# Patient Record
Sex: Female | Born: 1990 | Hispanic: Refuse to answer | Marital: Single | Smoking: Never smoker
Health system: Southern US, Community
[De-identification: ages and names within clinical notes are randomized; demographics above are authoritative.]

## PROBLEM LIST (undated history)

## (undated) DIAGNOSIS — Z789 Other specified health status: Secondary | ICD-10-CM

## (undated) HISTORY — PX: NO PAST SURGERIES: SHX2092

---
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
## 2011-02-10 LAB — TYPE AND SCREEN: ABO/RH(D): O POS

## 2011-02-10 LAB — CBC
HCT: 38.5 % (ref 36.0–46.0)
Hemoglobin: 12.6 g/dL (ref 12.0–15.0)
MCHC: 32.8 g/dL (ref 30.0–36.0)
RDW: 14.8 % (ref 11.5–15.5)

## 2011-02-10 LAB — RPR: RPR Ser Ql: NONREACTIVE

## 2013-11-09 NOTE — L&D Delivery Note (Signed)
Patient is 23 y.o. G2P1 4831w2d admitted with rupture of membranes in active labor, hx of aortic stenosis with previous child   Delivery Note At 9:25 AM a viable female was delivered via  (Presentation: ; Occiput Anterior).  APGAR: 9, 9; weight  .   Placenta status: Intact, Spontaneous.  Cord: 3 vessels with the following complications: None. In and out cath with 200cc urine, subsequently uterus became firm  Anesthesia: Epidural  Episiotomy: None Lacerations: 1st degree Suture Repair: 3.0 vicryl rapide Est. Blood Loss (mL):  150mL  Mom to postpartum.  Baby to Couplet care / Skin to Skin.  Stacey Clements [name] 10/31/2014, 9:51 AM

## 2014-10-12 LAB — OB RESULTS CONSOLE ABO/RH: RH Type: POSITIVE

## 2014-10-31 ENCOUNTER — Encounter (HOSPITAL_COMMUNITY): Payer: Self-pay | Admitting: *Deleted

## 2014-10-31 ENCOUNTER — Inpatient Hospital Stay (HOSPITAL_COMMUNITY): Payer: Medicaid Other | Admitting: Anesthesiology

## 2014-10-31 ENCOUNTER — Inpatient Hospital Stay (HOSPITAL_COMMUNITY)
Admission: AD | Admit: 2014-10-31 | Discharge: 2014-11-01 | DRG: 775 | Disposition: A | Discharge status: Home / Self Care | Payer: Medicaid Other | Source: Ambulatory Visit | Attending: Family Medicine | Admitting: Family Medicine

## 2014-10-31 DIAGNOSIS — Z3A37 37 weeks gestation of pregnancy: Secondary | ICD-10-CM | POA: Diagnosis present

## 2014-10-31 DIAGNOSIS — O0933 Supervision of pregnancy with insufficient antenatal care, third trimester: Secondary | ICD-10-CM

## 2014-10-31 DIAGNOSIS — O479 False labor, unspecified: Secondary | ICD-10-CM | POA: Diagnosis present

## 2014-10-31 HISTORY — DX: Other specified health status: Z78.9

## 2014-10-31 LAB — CBC
HCT: 38.5 % (ref 36.0–46.0)
Hemoglobin: 13 g/dL (ref 12.0–15.0)
MCH: 28 pg (ref 26.0–34.0)
MCHC: 33.8 g/dL (ref 30.0–36.0)
MCV: 83 fL (ref 78.0–100.0)
PLATELETS: 163 10*3/uL (ref 150–400)
RBC: 4.64 MIL/uL (ref 3.87–5.11)
RDW: 13.5 % (ref 11.5–15.5)
WBC: 11 10*3/uL — AB (ref 4.0–10.5)

## 2014-10-31 LAB — COMPREHENSIVE METABOLIC PANEL
ALK PHOS: 108 U/L (ref 39–117)
ALT: 13 U/L (ref 0–35)
AST: 19 U/L (ref 0–37)
Albumin: 3.3 g/dL — ABNORMAL LOW (ref 3.5–5.2)
Anion gap: 8 (ref 5–15)
BILIRUBIN TOTAL: 0.5 mg/dL (ref 0.3–1.2)
BUN: 11 mg/dL (ref 6–23)
CALCIUM: 8.7 mg/dL (ref 8.4–10.5)
CHLORIDE: 106 meq/L (ref 96–112)
CO2: 22 mmol/L (ref 19–32)
CREATININE: 0.46 mg/dL — AB (ref 0.50–1.10)
GFR calc Af Amer: >90 mL/min (ref >90)
GFR calc non Af Amer: >90 mL/min (ref >90)
GLUCOSE: 91 mg/dL (ref 70–99)
Potassium: 3.7 mmol/L (ref 3.5–5.1)
SODIUM: 136 mmol/L (ref 135–145)
Total Protein: 7 g/dL (ref 6.0–8.3)

## 2014-10-31 LAB — TYPE AND SCREEN
ABO/RH(D): O POS
Antibody Screen: NEGATIVE

## 2014-10-31 LAB — SAMPLE TO BLOOD BANK

## 2014-10-31 LAB — RAPID URINE DRUG SCREEN, HOSP PERFORMED
Amphetamines: NOT DETECTED
BENZODIAZEPINES: NOT DETECTED
Barbiturates: NOT DETECTED
Cocaine: NOT DETECTED
Opiates: NOT DETECTED
TETRAHYDROCANNABINOL: NOT DETECTED

## 2014-10-31 LAB — RPR

## 2014-10-31 LAB — GROUP B STREP BY PCR: Group B strep by PCR: NEGATIVE

## 2014-10-31 LAB — OB RESULTS CONSOLE GBS: GBS: NEGATIVE

## 2014-10-31 LAB — HIV ANTIBODY (ROUTINE TESTING W REFLEX): HIV 1&2 Ab, 4th Generation: NONREACTIVE

## 2014-10-31 LAB — HEPATITIS B SURFACE ANTIGEN: HEP B S AG: NEGATIVE

## 2014-10-31 MED ORDER — LIDOCAINE HCL (PF) 1 % IJ SOLN
30.0000 mL | INTRAMUSCULAR | Status: DC | PRN
  Administered 2014-10-31: 30 mL via SUBCUTANEOUS
  Filled 2014-10-31: qty 30

## 2014-10-31 MED ORDER — ACETAMINOPHEN 325 MG PO TABS: 650.0000 mg | ORAL_TABLET | ORAL | Status: DC | PRN

## 2014-10-31 MED ORDER — WITCH HAZEL-GLYCERIN EX PADS: 1.0000 "application " | MEDICATED_PAD | CUTANEOUS | Status: DC | PRN

## 2014-10-31 MED ORDER — BISACODYL 10 MG RE SUPP: 10.0000 mg | Freq: Every day | RECTAL | Status: DC | PRN

## 2014-10-31 MED ORDER — OXYTOCIN BOLUS FROM INFUSION
500.0000 mL | INTRAVENOUS | Status: DC
  Administered 2014-10-31: 500 mL via INTRAVENOUS

## 2014-10-31 MED ORDER — SIMETHICONE 80 MG PO CHEW
80.0000 mg | CHEWABLE_TABLET | ORAL | Status: DC | PRN
Start: 2014-10-31 — End: 2014-11-01

## 2014-10-31 MED ORDER — SODIUM CHLORIDE 0.9 % IJ SOLN: 3.0000 mL | Freq: Two times a day (BID) | INTRAMUSCULAR | Status: DC

## 2014-10-31 MED ORDER — TETANUS-DIPHTH-ACELL PERTUSSIS 5-2.5-18.5 LF-MCG/0.5 IM SUSP: 0.5000 mL | Freq: Once | INTRAMUSCULAR | Status: DC

## 2014-10-31 MED ORDER — LACTATED RINGERS IV SOLN: 500.0000 mL | INTRAVENOUS | Status: DC | PRN

## 2014-10-31 MED ORDER — DIPHENHYDRAMINE HCL 50 MG/ML IJ SOLN: 12.5000 mg | INTRAMUSCULAR | Status: DC | PRN

## 2014-10-31 MED ORDER — FLEET ENEMA 7-19 GM/118ML RE ENEM: 1.0000 | ENEMA | Freq: Every day | RECTAL | Status: DC | PRN

## 2014-10-31 MED ORDER — DIPHENHYDRAMINE HCL 25 MG PO CAPS: 25.0000 mg | ORAL_CAPSULE | Freq: Four times a day (QID) | ORAL | Status: DC | PRN

## 2014-10-31 MED ORDER — PHENYLEPHRINE 40 MCG/ML (10ML) SYRINGE FOR IV PUSH (FOR BLOOD PRESSURE SUPPORT)
80.0000 ug | PREFILLED_SYRINGE | INTRAVENOUS | Status: DC | PRN
  Filled 2014-10-31: qty 2

## 2014-10-31 MED ORDER — FLEET ENEMA 7-19 GM/118ML RE ENEM: 1.0000 | ENEMA | RECTAL | Status: DC | PRN

## 2014-10-31 MED ORDER — ONDANSETRON HCL 4 MG PO TABS: 4.0000 mg | ORAL_TABLET | ORAL | Status: DC | PRN

## 2014-10-31 MED ORDER — SODIUM CHLORIDE 0.9 % IJ SOLN: 3.0000 mL | INTRAMUSCULAR | Status: DC | PRN

## 2014-10-31 MED ORDER — PHENYLEPHRINE 40 MCG/ML (10ML) SYRINGE FOR IV PUSH (FOR BLOOD PRESSURE SUPPORT)
80.0000 ug | PREFILLED_SYRINGE | INTRAVENOUS | Status: DC | PRN
  Filled 2014-10-31: qty 10
  Filled 2014-10-31: qty 2

## 2014-10-31 MED ORDER — EPHEDRINE 5 MG/ML INJ
10.0000 mg | INTRAVENOUS | Status: DC | PRN
  Filled 2014-10-31: qty 2

## 2014-10-31 MED ORDER — CITRIC ACID-SODIUM CITRATE 334-500 MG/5ML PO SOLN: 30.0000 mL | ORAL | Status: DC | PRN

## 2014-10-31 MED ORDER — SODIUM CHLORIDE 0.9 % IV SOLN: 250.0000 mL | INTRAVENOUS | Status: DC | PRN

## 2014-10-31 MED ORDER — LIDOCAINE HCL (PF) 1 % IJ SOLN
INTRAMUSCULAR | Status: DC | PRN
  Administered 2014-10-31: 4 mL
  Administered 2014-10-31: 6 mL

## 2014-10-31 MED ORDER — ONDANSETRON HCL 4 MG/2ML IJ SOLN: 4.0000 mg | Freq: Four times a day (QID) | INTRAMUSCULAR | Status: DC | PRN

## 2014-10-31 MED ORDER — OXYCODONE-ACETAMINOPHEN 5-325 MG PO TABS: 1.0000 | ORAL_TABLET | ORAL | Status: DC | PRN

## 2014-10-31 MED ORDER — FENTANYL 2.5 MCG/ML BUPIVACAINE 1/10 % EPIDURAL INFUSION (WH - ANES)
14.0000 mL/h | INTRAMUSCULAR | Status: DC | PRN
  Administered 2014-10-31: 14 mL/h via EPIDURAL
  Filled 2014-10-31: qty 125

## 2014-10-31 MED ORDER — OXYTOCIN 40 UNITS IN LACTATED RINGERS INFUSION - SIMPLE MED: 62.5000 mL/h | INTRAVENOUS | Status: DC | PRN

## 2014-10-31 MED ORDER — OXYCODONE-ACETAMINOPHEN 5-325 MG PO TABS: 2.0000 | ORAL_TABLET | ORAL | Status: DC | PRN

## 2014-10-31 MED ORDER — FENTANYL 2.5 MCG/ML BUPIVACAINE 1/10 % EPIDURAL INFUSION (WH - ANES): 14.0000 mL/h | INTRAMUSCULAR | Status: DC | PRN

## 2014-10-31 MED ORDER — IBUPROFEN 600 MG PO TABS
600.0000 mg | ORAL_TABLET | Freq: Four times a day (QID) | ORAL | Status: DC
  Administered 2014-10-31 – 2014-11-01 (×5): 600 mg via ORAL
  Filled 2014-10-31 (×5): qty 1

## 2014-10-31 MED ORDER — LACTATED RINGERS IV SOLN
INTRAVENOUS | Status: DC
  Administered 2014-10-31: 08:00:00 via INTRAVENOUS

## 2014-10-31 MED ORDER — PRENATAL MULTIVITAMIN CH
1.0000 | ORAL_TABLET | Freq: Every day | ORAL | Status: DC
  Administered 2014-10-31 – 2014-11-01 (×2): 1 via ORAL
  Filled 2014-10-31 (×2): qty 1

## 2014-10-31 MED ORDER — DIBUCAINE 1 % RE OINT: 1.0000 "application " | TOPICAL_OINTMENT | RECTAL | Status: DC | PRN

## 2014-10-31 MED ORDER — ONDANSETRON HCL 4 MG/2ML IJ SOLN: 4.0000 mg | INTRAMUSCULAR | Status: DC | PRN

## 2014-10-31 MED ORDER — LACTATED RINGERS IV SOLN: 500.0000 mL | Freq: Once | INTRAVENOUS | Status: DC

## 2014-10-31 MED ORDER — ZOLPIDEM TARTRATE 5 MG PO TABS: 5.0000 mg | ORAL_TABLET | Freq: Every evening | ORAL | Status: DC | PRN

## 2014-10-31 MED ORDER — OXYTOCIN 40 UNITS IN LACTATED RINGERS INFUSION - SIMPLE MED
62.5000 mL/h | INTRAVENOUS | Status: DC
  Filled 2014-10-31: qty 1000

## 2014-10-31 MED ORDER — SENNOSIDES-DOCUSATE SODIUM 8.6-50 MG PO TABS
2.0000 | ORAL_TABLET | ORAL | Status: DC
  Administered 2014-11-01: 2 via ORAL
  Filled 2014-10-31: qty 2

## 2014-10-31 MED ORDER — BENZOCAINE-MENTHOL 20-0.5 % EX AERO: 1.0000 "application " | INHALATION_SPRAY | CUTANEOUS | Status: DC | PRN

## 2014-10-31 MED ORDER — LANOLIN HYDROUS EX OINT: TOPICAL_OINTMENT | CUTANEOUS | Status: DC | PRN

## 2014-10-31 NOTE — Anesthesia Preprocedure Evaluation (Signed)

## 2014-10-31 NOTE — Progress Notes (Signed)
UR chart review completed.  

## 2014-10-31 NOTE — Lactation Note (Addendum)
This note was copied from the chart of Stacey Martrice Furnas. Lactation Consultation Note  P2. Mother's nipples are cracked at base and very sore. Assisted mother in latching baby in fb, cross cradle and side lying. Mother winced in pain with each latch.  Demonstrated how to do chin tug. Assessed infant's mouth and noted some limited movement of tongue. Mother states she wants to formula feed and FOB encouraged mother. Mother has been wearing comfort gels. Suggested mother that she could pump if she is too sore to breastfeed but mother stated she preferred to give baby formula. Discussed supply and demand.  Suggest to mother to call if she wants further assistance. Provided mother with a hand pump to keep stimulating her breasts.  Patient Name: Stacey Jolene SchimkeHnhoa Buchholz ZOXWR'UToday's Clements: 10/31/2014 Reason for consult: Follow-up assessment   Maternal Data Has patient been taught Hand Expression?: Yes  Feeding Feeding Type: Breast Fed Length of feed: 15 min  LATCH Score/Interventions Latch: Repeated attempts needed to sustain latch, nipple held in mouth throughout feeding, stimulation needed to elicit sucking reflex. Intervention(s): Adjust position;Assist with latch;Breast compression;Breast massage  Audible Swallowing: A few with stimulation  Type of Nipple: Everted at rest and after stimulation  Comfort (Breast/Nipple): Engorged, cracked, bleeding, large blisters, severe discomfort Problem noted: Cracked, bleeding, blisters, bruises Intervention(s): Expressed breast milk to nipple     Hold (Positioning): Assistance needed to correctly position infant at breast and maintain latch. Intervention(s): Position options;Skin to skin;Support Pillows;Breastfeeding basics reviewed  LATCH Score: 5  Lactation Tools Discussed/Used     Consult Status Consult Status: Follow-up Clements: 11/01/14 Follow-up type: In-patient    [name], [name] [facility] 10/31/2014, 2:07 PM

## 2014-10-31 NOTE — Anesthesia Procedure Notes (Signed)

## 2014-10-31 NOTE — MAU Note (Signed)
Dr. Jackelyn KnifeMeisinger notified and pt will be assigned to Faculty as she left the practice at 20 weeks with little communication.

## 2014-10-31 NOTE — H&P (Signed)
LABOR ADMISSION HISTORY AND PHYSICAL  Stacey Clements is a 23 y.o. female G2P1 with IUP at 4370w2d  presenting in active labor with rupture of membranes.  She desires an epidural for labor pain control. She plans on breast feeding. She request depo for birth control.  Estimated Date of Delivery: 11/19/14  Prenatal History/Complications:  Past Medical History: Past Medical History  Diagnosis Date  . Medical history non-contributory     Past Surgical History: Past Surgical History  Procedure Laterality Date  . No past surgeries      Obstetrical History: OB History    Gravida Para Term Preterm AB TAB SAB Ectopic Multiple Living   2 1        1       Social History: History   Social History  . Marital Status: Single    Spouse Name: N/A    Number of Children: N/A  . Years of Education: N/A   Social History Main Topics  . Smoking status: Never Smoker   . Smokeless tobacco: None  . Alcohol Use: No  . Drug Use: No  . Sexual Activity: Yes   Other Topics Concern  . None   Social History Narrative  . None    Family History: History reviewed. No pertinent family history.  Allergies: No Known Allergies  No prescriptions prior to admission     Review of Systems   All systems reviewed and negative except as stated in HPI  Blood pressure 104/45, pulse 90, temperature 98.8 F (37.1 C), temperature source Oral, resp. rate 18, height 5\' 1"  (1.549 m), weight 173 lb 2 oz (78.529 kg). General appearance: alert and cooperative Lungs: clear to auscultation bilaterally Heart: regular rate and rhythm Abdomen: soft, non-tender; bowel sounds normal Extremities: Homans sign is negative, no sign of DVT Presentation: cephalic Dilation: 10 Effacement (%): 100 Station: +1 Exam by:: A.Davis,RN   Prenatal labs: ABO, Rh: O/Positive/-- (12/04 0000) Antibody:   Rubella:   RPR:    HBsAg:    HIV:    GBS: Negative (12/23 0000)  1 hr Glucola not available Genetic screening  Not  available Anatomy US not available   Results for orders placed or performed during the hospital encounter of 10/31/14 (from the past 24 hour(s))  OB RESULT CONSOLE Group B Strep   Collection Time: 10/31/14 12:00 AM  Result Value Ref Range   GBS Negative   Group B strep by PCR   Collection Time: 10/31/14  6:10 AM  Result Value Ref Range   Group B strep by PCR NEGATIVE NEGATIVE  CBC   Collection Time: 10/31/14  6:40 AM  Result Value Ref Range   WBC 11.0 (H) 4.0 - 10.5 K/uL   RBC 4.64 3.87 - 5.11 MIL/uL   Hemoglobin 13.0 12.0 - 15.0 g/dL   HCT 21.338.5 08.636.0 - 57.846.0 %   MCV 83.0 78.0 - 100.0 fL   MCH 28.0 26.0 - 34.0 pg   MCHC 33.8 30.0 - 36.0 g/dL   RDW 46.913.5 62.911.5 - 52.815.5 %   Platelets 163 150 - 400 K/uL  Comprehensive metabolic panel   Collection Time: 10/31/14  6:40 AM  Result Value Ref Range   Sodium 136 135 - 145 mmol/L   Potassium 3.7 3.5 - 5.1 mmol/L   Chloride 106 96 - 112 mEq/L   CO2 22 19 - 32 mmol/L   Glucose, Bld 91 70 - 99 mg/dL   BUN 11 6 - 23 mg/dL   Creatinine, Ser 4.130.46 (L) 0.50 -  1.10 mg/dL   Calcium 8.7 8.4 - 03.410.5 mg/dL   Total Protein 7.0 6.0 - 8.3 g/dL   Albumin 3.3 (L) 3.5 - 5.2 g/dL   AST 19 0 - 37 U/L   ALT 13 0 - 35 U/L   Alkaline Phosphatase 108 39 - 117 U/L   Total Bilirubin 0.5 0.3 - 1.2 mg/dL   GFR calc non Af Amer >90 >90 mL/min   GFR calc Af Amer >90 >90 mL/min   Anion gap 8 5 - 15  Sample to Blood Bank   Collection Time: 10/31/14  6:40 AM  Result Value Ref Range   Blood Bank Specimen SAMPLE AVAILABLE FOR TESTING    Sample Expiration 11/03/2014   Drug screen panel, emergency   Collection Time: 10/31/14  6:45 AM  Result Value Ref Range   Opiates NONE DETECTED NONE DETECTED   Cocaine NONE DETECTED NONE DETECTED   Benzodiazepines NONE DETECTED NONE DETECTED   Amphetamines NONE DETECTED NONE DETECTED   Tetrahydrocannabinol NONE DETECTED NONE DETECTED   Barbiturates NONE DETECTED NONE DETECTED    Patient Active Problem List   Diagnosis Date  Noted  . Threatened labor 10/31/2014    Assessment: Stacey Clements is a 23 y.o. G2P1 at 6562w2d here for rupture of membranes in active labor, received care initially with Dr. Jackelyn KnifeMeisinger, [place] and now here with no records of care  #ID:  PCR GBS neg #MOF: breast #MOC:depo #Circ:  N/a  Significant hx: hx of aortic stenosis with previous infant  Stacey Clements 10/31/2014, 9:46 AM

## 2014-10-31 NOTE — MAU Note (Addendum)
PT SAYS   AT 0300-   FLUID  WAS COMING  OUT IN BED-   SOAKED A  TOWEL.     NONE  NOW IN TRIAGE.    NO VE IN  OFFICE.   DENIES HSV AND   MRSA.   GBS-   NOT  DONE.   SEEN LAST    3 MTHS  AGO.  SHE JUST  MOVED  BACK  HERE LAST WED.

## 2014-10-31 NOTE — Anesthesia Postprocedure Evaluation (Signed)
  Anesthesia Post-op Note  Anesthesia Post Note  Patient: Stacey Clements  Procedure(s) Performed: * No procedures listed *  Anesthesia type: Epidural  Patient location: Mother/Baby  Post pain: Pain level controlled  Post assessment: Post-op Vital signs reviewed  Last Vitals:  Filed Vitals:   10/31/14 1411  BP: 118/68  Pulse: 95  Temp: 36.8 C  Resp: 18    Post vital signs: Reviewed  Level of consciousness:alert  Complications: No apparent anesthesia complications

## 2014-10-31 NOTE — Lactation Note (Signed)
This note was copied from the chart of Stacey Clements. Lactation Consultation Note  P2, Mother states she had diffculty latch first baby and pumped for 2 months. Mother had breastfed this baby x2 and she has cracks at the base of her nipple. Reviewed how to apply ebm and provided her with comfort gels. Left LC phone number and suggest she call for assistance with next feeding. Mom made aware of O/P services, breastfeeding support groups, community resources, and our phone # for post-discharge questions.    Patient Name: Stacey Jolene SchimkeHnhoa Vidrio ONGEX'BToday's Date: 10/31/2014 Reason for consult: Initial assessment   Maternal Data Has patient been taught Hand Expression?: Yes  Feeding Feeding Type: Breast Fed Length of feed: 15 min  LATCH Score/Interventions Latch: Repeated attempts needed to sustain latch, nipple held in mouth throughout feeding, stimulation needed to elicit sucking reflex. Intervention(s): Adjust position  Audible Swallowing: Spontaneous and intermittent  Type of Nipple: Everted at rest and after stimulation  Comfort (Breast/Nipple): Soft / non-tender     Hold (Positioning): Assistance needed to correctly position infant at breast and maintain latch.  LATCH Score: 8  Lactation Tools Discussed/Used     Consult Status Consult Status: Follow-up Date: 11/01/14 Follow-up type: In-patient    [name], [name] 10/31/2014, 12:31 PM

## 2014-10-31 NOTE — MAU Note (Signed)
Pt. To be admitted -

## 2014-11-01 NOTE — Discharge Instructions (Signed)

## 2014-11-01 NOTE — Discharge Summary (Signed)
  Obstetric Discharge Summary Reason for Admission: rupture of membranes Prenatal Procedures: none Intrapartum Procedures: spontaneous vaginal delivery Postpartum Procedures: none Complications-Operative and Postpartum: 1st degree perineal laceration  Patient is 23 y.o. G2P1 4050w2d admitted with rupture of membranes in active labor, hx of aortic stenosis with previous child   Delivery Note At 9:25 AM a viable female was delivered via  (Presentation: ; Occiput Anterior).  APGAR: 9, 9; weight  .   Placenta status: Intact, Spontaneous.  Cord: 3 vessels with the following complications: None. In and out cath with 200cc urine, subsequently uterus became firm  Anesthesia: Epidural  Episiotomy: None Lacerations: 1st degree Suture Repair: 3.0 vicryl rapide Est. Blood Loss (mL):  150mL  Mom to postpartum.  Baby to Couplet care / Skin to Skin.  Hospital Course:  Active Problems:   Threatened labor   Stacey Clements is a 23 y.o. G2P1001 s/p SVD.  Patient presented to [facility] with [facility], had an initial prenatal visit with Dr. Jackelyn KnifeMeisinger [place] then moved back.  Dr. Jackelyn KnifeMeisinger declined and referred patient to [facility].  She has postpartum course that was uncomplicated including no problems with ambulating, PO intake, urination, pain, or bleeding. The pt feels ready to go home and  will be discharged with outpatient follow-up.   Today: No acute events overnight.  Pt denies problems with ambulating, voiding or po intake.  She denies nausea or vomiting.  Pain is well controlled.  Lochia Moderate.  Plan for birth control is  depo.  Method of Feeding: breast   H/H: Lab Results  Component Value Date/Time   HGB 13.0 10/31/2014 06:40 AM   HCT 38.5 10/31/2014 06:40 AM    Discharge Diagnoses: Term Pregnancy-delivered  Discharge Information: Date: 11/01/2014 Activity: pelvic rest Diet: routine  Medications: None Breast feeding:   Yes Condition: stable Instructions: refer to handout Discharge to: home      Medication List    TAKE these medications        prenatal multivitamin Tabs tablet  Take 1 tablet by mouth daily at 12 noon.           Follow-up Information    Follow up with [facility] In 6 weeks.   Contact information:   [address] [place] [id number] [phone]       Perry MountACOSTA,Danija Gosa [name] ,MD OB Fellow 11/01/2014,12:46 PM

## 2014-11-01 NOTE — Progress Notes (Signed)
Clinical Social Work Department BRIEF PSYCHOSOCIAL ASSESSMENT 11/01/2014  Patient:  Stacey Clements,Stacey Clements     Account Number:  [id number]     Admit date:  10/31/2014  Clinical Social Worker:  Lucita Ferrara, CLINICAL SOCIAL WORKER  Date/Time:  11/01/2014 09:00 AM  Referred by:  Central Nursery  Date Referred:  10/31/2014 Referred for  Other - See comment   Other Referral:   Unable to confirm prenatal care during middle of pregnancy due to move to [place]. Baby's UDS is negative and the MDS is pending.    Interview type:  Patient  PSYCHOSOCIAL DATA Living Status:  FAMILY Primary support name:  Merrilee Jansky Primary support relationship to patient:  FOB Degree of support available:   MOB reported that she lives with her uncle, aunt, and older son.  She stated that the FOB lives at a different residence but is supportive and involved.   CURRENT CONCERNS Current Concerns  None Noted   SOCIAL WORK ASSESSMENT / PLAN CSW met with the MOB due to inability to confirm prenatal care.  MOB initiated care in [place], but moved to [place].  She reported receiving prenatal care in [place], but records have not yet been obtained.  MOB moved back to [place] one week ago.  MOB presented as receptive and easily engaged. She displayed an appropriate range in affect, presented in a pleasant mood, and was observed to be attending to and bonding with the baby.    MOB confirmed move to [place] during the pregnancy.  She shared that she moved since her uncle died (who lived in [place]), and she discussed that she and the rest of her family decided to move in order to be closer to other members of the family.  She shared that they did not enjoy living in [place], and she continued to discuss the challenges she faced to secure Medicaid.  She stated that they decided to move back to [place] due to "liking it better", and discussed plans to remain in [place].  MOB shared that she currently lives with her aunt and uncle, feels  well supported, and has the home prepared for the baby.  MOB reported low stress secondary to recent move, and denied presence of additional psychosocial stressors.   MOB acknowledged hospital drug screen policy due to inability to confirm prenatal care.  MOB denied all substance use during her pregnancy.    Assessment/plan status:  No Further Intervention Required/No barriers to discharge.  Other assessment/ plan:   CSW to monitor MDS and will make a CPS report if positive.   Information/referral to community resources:   No referrals needed at this time.   PATIENT'S/FAMILY'S RESPONSE TO PLAN OF CARE: MOB denied quesitons or concerns related to hospital drug screen policy.  She expressed appreication for the visit.

## 2014-12-21 ENCOUNTER — Ambulatory Visit (INDEPENDENT_AMBULATORY_CARE_PROVIDER_SITE_OTHER): Payer: Medicaid Other | Admitting: Obstetrics & Gynecology

## 2014-12-21 ENCOUNTER — Encounter: Payer: Self-pay | Admitting: Obstetrics & Gynecology

## 2014-12-21 VITALS — BP 115/63 | HR 85 | Temp 98.3°F | Wt 148.5 lb

## 2014-12-21 DIAGNOSIS — Z01812 Encounter for preprocedural laboratory examination: Secondary | ICD-10-CM

## 2014-12-21 DIAGNOSIS — Z3042 Encounter for surveillance of injectable contraceptive: Secondary | ICD-10-CM | POA: Insufficient documentation

## 2014-12-21 LAB — POCT PREGNANCY, URINE: PREG TEST UR: NEGATIVE

## 2014-12-21 MED ORDER — MEDROXYPROGESTERONE ACETATE 104 MG/0.65ML ~~LOC~~ SUSP
104.0000 mg | Freq: Once | SUBCUTANEOUS | Status: AC
  Administered 2014-12-21: 104 mg via SUBCUTANEOUS

## 2014-12-21 MED ORDER — MEDROXYPROGESTERONE ACETATE 104 MG/0.65ML ~~LOC~~ SUSP: 104.0000 mg | SUBCUTANEOUS | Status: DC

## 2014-12-21 NOTE — Progress Notes (Signed)
Patient here today for PP visit. Reports having protected intercourse last week. Desires depo for contraception. UPT obtained.

## 2014-12-21 NOTE — Patient Instructions (Signed)
Etonogestrel implant What is this medicine? ETONOGESTREL (et oh noe JES trel) is a contraceptive (birth control) device. It is used to prevent pregnancy. It can be used for up to 3 years. This medicine may be used for other purposes; ask your health care provider or pharmacist if you have questions. COMMON BRAND NAME(S): Implanon, Nexplanon What should I tell my health care provider before I take this medicine? They need to know if you have any of these conditions: -abnormal vaginal bleeding -blood vessel disease or blood clots -cancer of the breast, cervix, or liver -depression -diabetes -gallbladder disease -headaches -heart disease or recent heart attack -high blood pressure -high cholesterol -kidney disease -liver disease -renal disease -seizures -tobacco smoker -an unusual or allergic reaction to etonogestrel, other hormones, anesthetics or antiseptics, medicines, foods, dyes, or preservatives -pregnant or trying to get pregnant -breast-feeding How should I use this medicine? This device is inserted just under the skin on the inner side of your upper arm by a health care professional. Talk to your pediatrician regarding the use of this medicine in children. Special care may be needed. Overdosage: If you think you've taken too much of this medicine contact a poison control center or emergency room at once. Overdosage: If you think you have taken too much of this medicine contact a poison control center or emergency room at once. NOTE: This medicine is only for you. Do not share this medicine with others. What if I miss a dose? This does not apply. What may interact with this medicine? Do not take this medicine with any of the following medications: -amprenavir -bosentan -fosamprenavir This medicine may also interact with the following medications: -barbiturate medicines for inducing sleep or treating seizures -certain medicines for fungal infections like ketoconazole and  itraconazole -griseofulvin -medicines to treat seizures like carbamazepine, felbamate, oxcarbazepine, phenytoin, topiramate -modafinil -phenylbutazone -rifampin -some medicines to treat HIV infection like atazanavir, indinavir, lopinavir, nelfinavir, tipranavir, ritonavir -St. John's wort This list may not describe all possible interactions. Give your health care provider a list of all the medicines, herbs, non-prescription drugs, or dietary supplements you use. Also tell them if you smoke, drink alcohol, or use illegal drugs. Some items may interact with your medicine. What should I watch for while using this medicine? This product does not protect you against HIV infection (AIDS) or other sexually transmitted diseases. You should be able to feel the implant by pressing your fingertips over the skin where it was inserted. Tell your doctor if you cannot feel the implant. What side effects may I notice from receiving this medicine? Side effects that you should report to your doctor or health care professional as soon as possible: -allergic reactions like skin rash, itching or hives, swelling of the face, lips, or tongue -breast lumps -changes in vision -confusion, trouble speaking or understanding -dark urine -depressed mood -general ill feeling or flu-like symptoms -light-colored stools -loss of appetite, nausea -right upper belly pain -severe headaches -severe pain, swelling, or tenderness in the abdomen -shortness of breath, chest pain, swelling in a leg -signs of pregnancy -sudden numbness or weakness of the face, arm or leg -trouble walking, dizziness, loss of balance or coordination -unusual vaginal bleeding, discharge -unusually weak or tired -yellowing of the eyes or skin Side effects that usually do not require medical attention (Report these to your doctor or health care professional if they continue or are bothersome.): -acne -breast pain -changes in  weight -cough -fever or chills -headache -irregular menstrual bleeding -itching, burning, and   vaginal discharge -pain or difficulty passing urine -sore throat This list may not describe all possible side effects. Call your doctor for medical advice about side effects. You may report side effects to FDA at 1-800-FDA-1088. Where should I keep my medicine? This drug is given in a hospital or clinic and will not be stored at home. NOTE: This sheet is a summary. It may not cover all possible information. If you have questions about this medicine, talk to your doctor, pharmacist, or health care provider.  2015, Elsevier/Gold Standard. (2012-05-02 15:37:45) Medroxyprogesterone injection [Contraceptive] What is this medicine? MEDROXYPROGESTERONE (me DROX ee proe JES te rone) contraceptive injections prevent pregnancy. They provide effective birth control for 3 months. Depo-subQ Provera 104 is also used for treating pain related to endometriosis. This medicine may be used for other purposes; ask your health care provider or pharmacist if you have questions. COMMON BRAND NAME(S): Depo-Provera, Depo-subQ Provera 104 What should I tell my health care provider before I take this medicine? They need to know if you have any of these conditions: -frequently drink alcohol -asthma -blood vessel disease or a history of a blood clot in the lungs or legs -bone disease such as osteoporosis -breast cancer -diabetes -eating disorder (anorexia nervosa or bulimia) -high blood pressure -HIV infection or AIDS -kidney disease -liver disease -mental depression -migraine -seizures (convulsions) -stroke -tobacco smoker -vaginal bleeding -an unusual or allergic reaction to medroxyprogesterone, other hormones, medicines, foods, dyes, or preservatives -pregnant or trying to get pregnant -breast-feeding How should I use this medicine? Depo-Provera Contraceptive injection is given into a muscle. Depo-subQ  Provera 104 injection is given under the skin. These injections are given by a health care professional. You must not be pregnant before getting an injection. The injection is usually given during the first 5 days after the start of a menstrual period or 6 weeks after delivery of a baby. Talk to your pediatrician regarding the use of this medicine in children. Special care may be needed. These injections have been used in female children who have started having menstrual periods. Overdosage: If you think you have taken too much of this medicine contact a poison control center or emergency room at once. NOTE: This medicine is only for you. Do not share this medicine with others. What if I miss a dose? Try not to miss a dose. You must get an injection once every 3 months to maintain birth control. If you cannot keep an appointment, call and reschedule it. If you wait longer than 13 weeks between Depo-Provera contraceptive injections or longer than 14 weeks between Depo-subQ Provera 104 injections, you could get pregnant. Use another method for birth control if you miss your appointment. You may also need a pregnancy test before receiving another injection. What may interact with this medicine? Do not take this medicine with any of the following medications: -bosentan This medicine may also interact with the following medications: -aminoglutethimide -antibiotics or medicines for infections, especially rifampin, rifabutin, rifapentine, and griseofulvin -aprepitant -barbiturate medicines such as phenobarbital or primidone -bexarotene -carbamazepine -medicines for seizures like ethotoin, felbamate, oxcarbazepine, phenytoin, topiramate -modafinil -St. John's wort This list may not describe all possible interactions. Give your health care provider a list of all the medicines, herbs, non-prescription drugs, or dietary supplements you use. Also tell them if you smoke, drink alcohol, or use illegal drugs.  Some items may interact with your medicine. What should I watch for while using this medicine? This drug does not protect you against HIV infection (  AIDS) or other sexually transmitted diseases. Use of this product may cause you to lose calcium from your bones. Loss of calcium may cause weak bones (osteoporosis). Only use this product for more than 2 years if other forms of birth control are not right for you. The longer you use this product for birth control the more likely you will be at risk for weak bones. Ask your health care professional how you can keep strong bones. You may have a change in bleeding pattern or irregular periods. Many females stop having periods while taking this drug. If you have received your injections on time, your chance of being pregnant is very low. If you think you may be pregnant, see your health care professional as soon as possible. Tell your health care professional if you want to get pregnant within the next year. The effect of this medicine may last a long time after you get your last injection. What side effects may I notice from receiving this medicine? Side effects that you should report to your doctor or health care professional as soon as possible: -allergic reactions like skin rash, itching or hives, swelling of the face, lips, or tongue -breast tenderness or discharge -breathing problems -changes in vision -depression -feeling faint or lightheaded, falls -fever -pain in the abdomen, chest, groin, or leg -problems with balance, talking, walking -unusually weak or tired -yellowing of the eyes or skin Side effects that usually do not require medical attention (report to your doctor or health care professional if they continue or are bothersome): -acne -fluid retention and swelling -headache -irregular periods, spotting, or absent periods -temporary pain, itching, or skin reaction at site where injected -weight gain This list may not describe all  possible side effects. Call your doctor for medical advice about side effects. You may report side effects to FDA at 1-800-FDA-1088. Where should I keep my medicine? This does not apply. The injection will be given to you by a health care professional. NOTE: This sheet is a summary. It may not cover all possible information. If you have questions about this medicine, talk to your doctor, pharmacist, or health care provider.  2015, Elsevier/Gold Standard. (2008-11-16 18:37:56)

## 2014-12-21 NOTE — Progress Notes (Signed)
Patient ID: Stacey Clements, female   DOB: 09/08/1991, 24 y.o.   MRN: [id number] Subjective: wants Depo but might switch to Nexplanon     Stacey Clements is a 24 y.o. female who presents for a postpartum visit. She is 6 weeks postpartum following a spontaneous vaginal delivery. I have fully reviewed the prenatal and intrapartum course. The delivery was at 39 gestational weeks. Outcome: spontaneous vaginal delivery. Anesthesia: epidural. Postpartum course has been good. Baby's course has been good. Baby is feeding by breast. Bleeding no bleeding. Bowel function is normal. Bladder function is normal. Patient is sexually active. Contraception method is condoms. Postpartum depression screening: negative.  The following portions of the patient's history were reviewed and updated as appropriate: allergies, current medications, past family history, past medical history, past social history, past surgical history and problem list.  Review of Systems Pertinent items are noted in HPI.   Objective:    BP 115/63 mmHg  Pulse 85  Temp(Src) 98.3 F (36.8 C) (Oral)  Wt 148 lb 8 oz (67.359 kg)  Breastfeeding? Yes  General:  alert, cooperative and no distress   Breasts:           Abdomen: soft, non-tender; bowel sounds normal; no masses,  no organomegaly   Vulva:  not evaluated  Vagina: not evaluated  Cervix:     Corpus:    Adnexa:  not evaluated  Rectal Exam: Not performed.        Assessment:     normal postpartum exam. Pap smear not done at today's visit.   Plan:    1. Contraception: Depo-Provera injections 2. Info on Nexplanon and counseled 3. Follow up in: 3 months or as needed.    Adam PhenixJames G Arnold, MD 12/21/2014

## 2015-03-13 ENCOUNTER — Ambulatory Visit: Payer: Medicaid Other

## 2016-11-09 NOTE — L&D Delivery Note (Signed)
Delivery Note At 6:44 AM a viable female was delivered via Vaginal, Spontaneous (Presentation: direct OA).  APGAR: 9/9 ; weight pending  .   Placenta status: intact 3VC:  with no following complications: .  Cord pH: n/a  Anesthesia:  epidural Episiotomy: None Lacerations: none requiring repair Suture Repair: n/a Est. Blood Loss (mL):  200  Mom to postpartum.  Baby to Couplet care / Skin to Skin.  Marthenia RollingScott Les Longmore 10/17/2017, 6:59 AM

## 2017-06-15 ENCOUNTER — Encounter: Payer: Self-pay | Admitting: Certified Nurse Midwife

## 2017-06-15 ENCOUNTER — Other Ambulatory Visit (HOSPITAL_COMMUNITY)
Admission: RE | Admit: 2017-06-15 | Discharge: 2017-06-15 | Disposition: A | Discharge status: Home / Self Care | Payer: Medicaid Other | Source: Ambulatory Visit | Attending: Certified Nurse Midwife | Admitting: Certified Nurse Midwife

## 2017-06-15 ENCOUNTER — Ambulatory Visit (INDEPENDENT_AMBULATORY_CARE_PROVIDER_SITE_OTHER): Payer: Medicaid Other | Admitting: Certified Nurse Midwife

## 2017-06-15 DIAGNOSIS — Z3482 Encounter for supervision of other normal pregnancy, second trimester: Secondary | ICD-10-CM | POA: Diagnosis not present

## 2017-06-15 DIAGNOSIS — Z3480 Encounter for supervision of other normal pregnancy, unspecified trimester: Secondary | ICD-10-CM | POA: Diagnosis not present

## 2017-06-15 DIAGNOSIS — O0932 Supervision of pregnancy with insufficient antenatal care, second trimester: Secondary | ICD-10-CM

## 2017-06-15 NOTE — Progress Notes (Signed)
Subjective:    Stacey Clements is being seen today for her first obstetrical visit.  This is not a planned pregnancy. She is at [redacted]w[redacted]d gestation. Her obstetrical history is significant for none. Relationship with FOB: spouse, living together. Patient does intend to breast feed. Pregnancy history fully reviewed.  The information documented in the HPI was reviewed and verified.  Menstrual History: OB History    Gravida Para Term Preterm AB Living   3 2 2     2    SAB TAB Ectopic Multiple Live Births         0 2       Patient's last menstrual period was 01/22/2017 (approximate).    Past Medical History:  Diagnosis Date  . Medical history non-contributory     Past Surgical History:  Procedure Laterality Date  . NO PAST SURGERIES       (Not in a hospital admission) No Known Allergies  Social History  Substance Use Topics  . Smoking status: Never Smoker  . Smokeless tobacco: Never Used  . Alcohol use No    History reviewed. No pertinent family history.   Review of Systems Constitutional: negative for weight loss Gastrointestinal: negative for vomiting Genitourinary:negative for genital lesions and vaginal discharge and dysuria Musculoskeletal:negative for back pain Behavioral/Psych: negative for abusive relationship, depression, illegal drug usage and tobacco use    Objective:    BP 111/66   Pulse (!) 101   Wt 151 lb 9.6 oz (68.8 kg)   LMP 01/22/2017 (Approximate)   BMI 28.64 kg/m  General Appearance:    Alert, cooperative, no distress, appears stated age  Head:    Normocephalic, without obvious abnormality, atraumatic  Eyes:    PERRL, conjunctiva/corneas clear, EOM's intact, fundi    benign, both eyes  Ears:    Normal TM's and external ear canals, both ears  Nose:   Nares normal, septum midline, mucosa normal, no drainage    or sinus tenderness  Throat:   Lips, mucosa, and tongue normal; teeth and gums normal  Neck:   Supple, symmetrical, trachea midline, no adenopathy;     thyroid:  no enlargement/tenderness/nodules; no carotid   bruit or JVD  Back:     Symmetric, no curvature, ROM normal, no CVA tenderness  Lungs:     Clear to auscultation bilaterally, respirations unlabored  Chest Wall:    No tenderness or deformity   Heart:    Regular rate and rhythm, S1 and S2 normal, no murmur, rub   or gallop  Breast Exam:    No tenderness, masses, or nipple abnormality  Abdomen:     Soft, non-tender, bowel sounds active all four quadrants,    no masses, no organomegaly  Genitalia:    Normal female without lesion, discharge or tenderness  Extremities:   Extremities normal, atraumatic, no cyanosis or edema  Pulses:   2+ and symmetric all extremities  Skin:   Skin color, texture, turgor normal, no rashes or lesions  Lymph nodes:   Cervical, supraclavicular, and axillary nodes normal  Neurologic:   CNII-XII intact, normal strength, sensation and reflexes    throughout          Cervix: friable on exam, closed, thick, posterior.  FH: 16cm.  FHR: 150 by doppler  Lab Review Urine pregnancy test Labs reviewed yes Radiologic studies reviewed no   Assessment & Plan    Pregnancy at [redacted]w[redacted]d weeks    1. Supervision of other normal pregnancy, antepartum     - Cytology -  PAP - Cervicovaginal ancillary only - Hemoglobinopathy evaluation - HIV antibody - VITAMIN D 25 Hydroxy (Vit-D Deficiency, Fractures) - Culture, OB Urine - MaterniT21 PLUS Core+SCA - Cystic Fibrosis Mutation 97 - Obstetric Panel, Including HIV - Hemoglobin A1c - Varicella zoster antibody, IgG - AFP, Serum, Open Spina Bifida - [place] MFM OB COMP + 14 WK; Future  2. Late prenatal care affecting pregnancy in second trimester     Around 20 weeks - [place] MFM OB COMP + 14 WK; Future     Prenatal vitamins.  Counseling provided regarding continued use of seat belts, cessation of alcohol consumption, smoking or use of illicit drugs; infection precautions i.e., influenza/TDAP immunizations,  toxoplasmosis,CMV, parvovirus, listeria and varicella; workplace safety, exercise during pregnancy; routine dental care, safe medications, sexual activity, hot tubs, saunas, pools, travel, caffeine use, fish and methlymercury, potential toxins, hair treatments, varicose veins Weight gain recommendations per IOM guidelines reviewed: underweight/BMI< 18.5--> gain 28 - 40 lbs; normal weight/BMI 18.5 - 24.9--> gain 25 - 35 lbs; overweight/BMI 25 - 29.9--> gain 15 - 25 lbs; obese/BMI >30->gain  11 - 20 lbs Problem list reviewed and updated. FIRST/CF mutation testing/NIPT/QUAD SCREEN/fragile X/Ashkenazi Jewish population testing/Spinal muscular atrophy discussed: ordered. Role of ultrasound in pregnancy discussed; fetal survey: ordered. Amniocentesis discussed: not indicated.  Meds ordered this encounter  Medications  . Prenatal MV-Min-FA-Omega-3 (PRENATAL GUMMIES/DHA & FA) 0.4-32.5 MG CHEW    Sig: Chew by mouth.   Orders Placed This Encounter  Procedures  . Culture, OB Urine  . [place] MFM OB COMP + 14 WK    Standing Status:   Future    Standing Expiration Date:   08/15/2018    Order Specific Question:   Reason for Exam (SYMPTOM  OR DIAGNOSIS REQUIRED)    Answer:   fetal anatomy scan, dating    Order Specific Question:   Preferred imaging location?    Answer:   [facility]Ul[facility]  . Hemoglobinopathy evaluation  . HIV antibody  . VITAMIN D 25 Hydroxy (Vit-D Deficiency, Fractures)  . MaterniT21 PLUS Core+SCA    Order Specific Question:   Is the patient insulin dependent?    Answer:   No    Order Specific Question:   Please enter gestational age. This should be expressed as weeks AND days, i.e. 16w 6d. Enter weeks here. Enter days in next question.    Answer:   19    Order Specific Question:   Please enter gestational age. This should be expressed as weeks AND days, i.e. 16w 6d. Enter days here. Enter weeks in previous question.    Answer:   4    Order Specific Question:   How was gestational age  calculated?    Answer:   LMP    Order Specific Question:   Please give the date of LMP OR Ultrasound OR Estimated date of delivery.    Answer:   10/29/2017    Order Specific Question:   Number of Fetuses (Type of Pregnancy):    Answer:   1    Order Specific Question:   Indications for performing the test? (please choose all that apply):    Answer:   Routine screening    Order Specific Question:   Other Indications? (Y=Yes, N=No)    Answer:   N    Order Specific Question:   If this is a repeat specimen, please indicate the reason:    Answer:   Not indicated    Order Specific Question:   Please specify the patient's race: (  C=White/Caucasion, B=Black, I=Native American, A=Asian, H=Hispanic, O=Other, U=Unknown)    Answer:   A    Order Specific Question:   Donor Egg - indicate if the egg was obtained from in vitro fertilization.    Answer:   N    Order Specific Question:   Age of Egg Donor.    Answer:   4925    Order Specific Question:   Prior Down Syndrome/ONTD screening during current pregnancy.    Answer:   N    Order Specific Question:   Prior First Trimester Testing    Answer:   N    Order Specific Question:   Prior Second Trimester Testing    Answer:   N    Order Specific Question:   Family History of Neural Tube Defects    Answer:   N    Order Specific Question:   Prior Pregnancy with Down Syndrome    Answer:   N    Order Specific Question:   Please give the patient's weight (in pounds)    Answer:   152  . Cystic Fibrosis Mutation 97  . Obstetric Panel, Including HIV  . Hemoglobin A1c  . Varicella zoster antibody, IgG  . AFP, Serum, Open Spina Bifida    Order Specific Question:   Is patient insulin dependent?    Answer:   No    Order Specific Question:   Weight (lbs)    Answer:   40152    Order Specific Question:   Gestational Age (GA), weeks    Answer:   7720.4    Order Specific Question:   Date on which patient was at this GA    Answer:   06/15/2017    Order Specific Question:    GA Calculation Method    Answer:   LMP    Order Specific Question:   GA Date    Answer:   10/29/2017    Order Specific Question:   Number of fetuses    Answer:   1    Order Specific Question:   Reason for screen    Answer:   PDOWNS    Order Specific Question:   Donor egg?    Answer:   N    Order Specific Question:   Age of egg donor?    Answer:   25    Follow up in 4 weeks. 50% of 30 min visit spent on counseling and coordination of care.

## 2017-06-15 NOTE — Progress Notes (Signed)
Patient reports no concerns today 

## 2017-06-16 LAB — CERVICOVAGINAL ANCILLARY ONLY
Bacterial vaginitis: NEGATIVE
CANDIDA VAGINITIS: NEGATIVE
CHLAMYDIA, DNA PROBE: NEGATIVE
Neisseria Gonorrhea: NEGATIVE
TRICH (WINDOWPATH): NEGATIVE

## 2017-06-17 LAB — CYTOLOGY - PAP: DIAGNOSIS: NEGATIVE

## 2017-06-17 LAB — URINE CULTURE, OB REFLEX

## 2017-06-17 LAB — CULTURE, OB URINE

## 2017-06-18 LAB — HEMOGLOBINOPATHY EVALUATION
HGB C: 0 %
HGB S: 0 %
HGB VARIANT: 0 %
Hemoglobin A2 Quantitation: 3 % (ref 1.8–3.2)
Hemoglobin F Quantitation: 0.8 % (ref 0.0–2.0)
Hgb A: 96.2 % — ABNORMAL LOW (ref 96.4–98.8)

## 2017-06-18 LAB — AFP, SERUM, OPEN SPINA BIFIDA
AFP MOM: 1.5
AFP Value: 90 ng/mL
Gest. Age on Collection Date: 20.6 weeks
Maternal Age At EDD: 26.2 yr
OSBR RISK 1 IN: 2734
TEST RESULTS AFP: NEGATIVE
Weight: 152 [lb_av]

## 2017-06-18 LAB — OBSTETRIC PANEL, INCLUDING HIV
Antibody Screen: NEGATIVE
Basophils Absolute: 0 10*3/uL (ref 0.0–0.2)
Basos: 0 %
EOS (ABSOLUTE): 0 10*3/uL (ref 0.0–0.4)
Eos: 0 %
HIV Screen 4th Generation wRfx: NONREACTIVE
Hematocrit: 37.5 % (ref 34.0–46.6)
Hemoglobin: 12.6 g/dL (ref 11.1–15.9)
Hepatitis B Surface Ag: NEGATIVE
Immature Grans (Abs): 0.1 10*3/uL (ref 0.0–0.1)
Immature Granulocytes: 1 %
Lymphocytes Absolute: 1.5 10*3/uL (ref 0.7–3.1)
Lymphs: 18 %
MCH: 29.3 pg (ref 26.6–33.0)
MCHC: 33.6 g/dL (ref 31.5–35.7)
MCV: 87 fL (ref 79–97)
MONOS ABS: 0.8 10*3/uL (ref 0.1–0.9)
Monocytes: 9 %
NEUTROS ABS: 6.1 10*3/uL (ref 1.4–7.0)
Neutrophils: 72 %
PLATELETS: 191 10*3/uL (ref 150–379)
RBC: 4.3 x10E6/uL (ref 3.77–5.28)
RDW: 14.3 % (ref 12.3–15.4)
RH TYPE: POSITIVE
RPR: NONREACTIVE
Rubella Antibodies, IGG: 6.7 index (ref >0.99)
WBC: 8.6 10*3/uL (ref 3.4–10.8)

## 2017-06-18 LAB — HEMOGLOBIN A1C
Est. average glucose Bld gHb Est-mCnc: 91 mg/dL
Hgb A1c MFr Bld: 4.8 % (ref 4.8–5.6)

## 2017-06-18 LAB — VITAMIN D 25 HYDROXY (VIT D DEFICIENCY, FRACTURES): VIT D 25 HYDROXY: 32.5 ng/mL (ref 30.0–100.0)

## 2017-06-18 LAB — VARICELLA ZOSTER ANTIBODY, IGG: Varicella zoster IgG: 812 index (ref >165)

## 2017-06-20 LAB — MATERNIT21 PLUS CORE+SCA
CHROMOSOME 21: NEGATIVE
Chromosome 13: NEGATIVE
Chromosome 18: NEGATIVE
Y Chromosome: NOT DETECTED

## 2017-06-22 ENCOUNTER — Other Ambulatory Visit: Payer: Self-pay | Admitting: Certified Nurse Midwife

## 2017-06-22 DIAGNOSIS — Z3480 Encounter for supervision of other normal pregnancy, unspecified trimester: Secondary | ICD-10-CM

## 2017-06-22 LAB — CYSTIC FIBROSIS MUTATION 97: GENE DIS ANAL CARRIER INTERP BLD/T-IMP: NOT DETECTED

## 2017-06-30 ENCOUNTER — Ambulatory Visit (HOSPITAL_COMMUNITY)
Admission: RE | Admit: 2017-06-30 | Discharge: 2017-06-30 | Disposition: A | Discharge status: Home / Self Care | Payer: Medicaid Other | Source: Ambulatory Visit | Attending: Certified Nurse Midwife | Admitting: Certified Nurse Midwife

## 2017-06-30 ENCOUNTER — Other Ambulatory Visit: Payer: Self-pay | Admitting: Certified Nurse Midwife

## 2017-06-30 DIAGNOSIS — O0932 Supervision of pregnancy with insufficient antenatal care, second trimester: Secondary | ICD-10-CM

## 2017-06-30 DIAGNOSIS — Z3689 Encounter for other specified antenatal screening: Secondary | ICD-10-CM | POA: Diagnosis not present

## 2017-06-30 DIAGNOSIS — Z3687 Encounter for antenatal screening for uncertain dates: Secondary | ICD-10-CM | POA: Diagnosis not present

## 2017-06-30 DIAGNOSIS — Z3A21 21 weeks gestation of pregnancy: Secondary | ICD-10-CM | POA: Diagnosis not present

## 2017-06-30 DIAGNOSIS — Z3A22 22 weeks gestation of pregnancy: Secondary | ICD-10-CM

## 2017-06-30 DIAGNOSIS — Z363 Encounter for antenatal screening for malformations: Secondary | ICD-10-CM | POA: Diagnosis not present

## 2017-06-30 DIAGNOSIS — Z3480 Encounter for supervision of other normal pregnancy, unspecified trimester: Secondary | ICD-10-CM

## 2017-07-01 ENCOUNTER — Other Ambulatory Visit: Payer: Self-pay | Admitting: Certified Nurse Midwife

## 2017-07-01 DIAGNOSIS — Z3480 Encounter for supervision of other normal pregnancy, unspecified trimester: Secondary | ICD-10-CM

## 2017-07-13 ENCOUNTER — Encounter: Payer: Self-pay | Admitting: Certified Nurse Midwife

## 2017-07-13 ENCOUNTER — Ambulatory Visit (INDEPENDENT_AMBULATORY_CARE_PROVIDER_SITE_OTHER): Payer: Medicaid Other | Admitting: Certified Nurse Midwife

## 2017-07-13 VITALS — BP 104/65 | HR 90 | Wt 158.0 lb

## 2017-07-13 DIAGNOSIS — Z3480 Encounter for supervision of other normal pregnancy, unspecified trimester: Secondary | ICD-10-CM

## 2017-07-13 DIAGNOSIS — Z3482 Encounter for supervision of other normal pregnancy, second trimester: Secondary | ICD-10-CM

## 2017-07-13 NOTE — Progress Notes (Signed)
   PRENATAL VISIT NOTE  Subjective:  Stacey Clements is a 26 y.o. G3P2002 at 3554w4d being seen today for ongoing prenatal care.  She is currently monitored for the following issues for this low-risk pregnancy and has Supervision of other normal pregnancy, antepartum and Late prenatal care affecting pregnancy in second trimester on her problem list.  Patient reports no complaints.  Contractions: Not present. Vag. Bleeding: None.  Movement: Present. Denies leaking of fluid.   The following portions of the patient's history were reviewed and updated as appropriate: allergies, current medications, past family history, past medical history, past social history, past surgical history and problem list. Problem list updated.  Objective:   Vitals:   07/13/17 1036  BP: 104/65  Pulse: 90  Weight: 158 lb (71.7 kg)    Fetal Status: Fetal Heart Rate (bpm): 155; doppler Fundal Height: 24 cm Movement: Present     General:  Alert, oriented and cooperative. Patient is in no acute distress.  Skin: Skin is warm and dry. No rash noted.   Cardiovascular: Normal heart rate noted  Respiratory: Normal respiratory effort, no problems with respiration noted  Abdomen: Soft, gravid, appropriate for gestational age.  Pain/Pressure: Absent     Pelvic: Cervical exam deferred        Extremities: Normal range of motion.     Mental Status:  Normal mood and affect. Normal behavior. Normal judgment and thought content.   Assessment and Plan:  Pregnancy: G3P2002 at 8354w4d  1. Supervision of other normal pregnancy, antepartum     Doing well.  Preterm labor symptoms and general obstetric precautions including but not limited to vaginal bleeding, contractions, leaking of fluid and fetal movement were reviewed in detail with the patient. Please refer to After Visit Summary for other counseling recommendations.  Return in about 4 weeks (around 08/10/2017) for ROB, 2 hr OGTT.   Roe Coombsachelle A Anaija Wissink, CNM

## 2017-08-10 ENCOUNTER — Other Ambulatory Visit: Payer: Self-pay | Admitting: Certified Nurse Midwife

## 2017-08-10 ENCOUNTER — Ambulatory Visit (HOSPITAL_COMMUNITY)
Admission: RE | Admit: 2017-08-10 | Discharge: 2017-08-10 | Disposition: A | Discharge status: Home / Self Care | Payer: Medicaid Other | Source: Ambulatory Visit | Attending: Certified Nurse Midwife | Admitting: Certified Nurse Midwife

## 2017-08-10 ENCOUNTER — Encounter: Payer: Medicaid Other | Admitting: Certified Nurse Midwife

## 2017-08-10 DIAGNOSIS — Z3A27 27 weeks gestation of pregnancy: Secondary | ICD-10-CM | POA: Diagnosis not present

## 2017-08-10 DIAGNOSIS — Z3480 Encounter for supervision of other normal pregnancy, unspecified trimester: Secondary | ICD-10-CM

## 2017-08-10 DIAGNOSIS — Z362 Encounter for other antenatal screening follow-up: Secondary | ICD-10-CM

## 2017-08-10 DIAGNOSIS — Z3A28 28 weeks gestation of pregnancy: Secondary | ICD-10-CM

## 2017-08-11 ENCOUNTER — Other Ambulatory Visit: Payer: Medicaid Other

## 2017-08-11 ENCOUNTER — Encounter: Payer: Self-pay | Admitting: Certified Nurse Midwife

## 2017-08-11 ENCOUNTER — Ambulatory Visit (INDEPENDENT_AMBULATORY_CARE_PROVIDER_SITE_OTHER): Payer: Medicaid Other | Admitting: Certified Nurse Midwife

## 2017-08-11 VITALS — BP 111/72 | HR 96 | Wt 164.3 lb

## 2017-08-11 DIAGNOSIS — Z3483 Encounter for supervision of other normal pregnancy, third trimester: Secondary | ICD-10-CM

## 2017-08-11 DIAGNOSIS — Z3480 Encounter for supervision of other normal pregnancy, unspecified trimester: Secondary | ICD-10-CM

## 2017-08-11 NOTE — Progress Notes (Signed)
ROB/GTT. Deferred FLU and TDAP until next visit. VIS given.

## 2017-08-11 NOTE — Progress Notes (Addendum)
   PRENATAL VISIT NOTE  Subjective:  Stacey Clements is a 26 y.o. G3P2002 at [redacted]w[redacted]d being seen today for ongoing prenatal care.  She is currently monitored for the following issues for this low-risk pregnancy and has Supervision of other normal pregnancy, antepartum and Late prenatal care affecting pregnancy in second trimester on her problem list.  Patient reports no complaints.  Contractions: Not present. Vag. Bleeding: None.  Movement: Present. Denies leaking of fluid.   The following portions of the patient's history were reviewed and updated as appropriate: allergies, current medications, past family history, past medical history, past social history, past surgical history and problem list. Problem list updated.  Objective:   Vitals:   08/11/17 0816  BP: 111/72  Pulse: 96  Weight: 164 lb 4.8 oz (74.5 kg)    Fetal Status: Fetal Heart Rate (bpm): 133; doppler Fundal Height: 27 cm Movement: Present     General:  Alert, oriented and cooperative. Patient is in no acute distress.  Skin: Skin is warm and dry. No rash noted.   Cardiovascular: Normal heart rate noted  Respiratory: Normal respiratory effort, no problems with respiration noted  Abdomen: Soft, gravid, appropriate for gestational age.  Pain/Pressure: Present     Pelvic: Cervical exam deferred        Extremities: Normal range of motion.  Edema: Trace  Mental Status:  Normal mood and affect. Normal behavior. Normal judgment and thought content.   Assessment and Plan:  Pregnancy: G3P2002 at [redacted]w[redacted]d  1. Supervision of other normal pregnancy, antepartum      2 hour OGTT today/labs.  F/U US results reviewed from 08/10/17: normal.   Preterm labor symptoms and general obstetric precautions including but not limited to vaginal bleeding, contractions, leaking of fluid and fetal movement were reviewed in detail with the patient. Please refer to After Visit Summary for other counseling recommendations.  Return in about 2 weeks (around  08/25/2017) for ROB: TDaP/Influenza vac.   Roe Coombs, CNM

## 2017-08-12 ENCOUNTER — Other Ambulatory Visit: Payer: Self-pay | Admitting: Certified Nurse Midwife

## 2017-08-12 DIAGNOSIS — Z3480 Encounter for supervision of other normal pregnancy, unspecified trimester: Secondary | ICD-10-CM

## 2017-08-12 LAB — CBC
Hematocrit: 35.8 % (ref 34.0–46.6)
Hemoglobin: 11.5 g/dL (ref 11.1–15.9)
MCH: 27.8 pg (ref 26.6–33.0)
MCHC: 32.1 g/dL (ref 31.5–35.7)
MCV: 87 fL (ref 79–97)
Platelets: 180 10*3/uL (ref 150–379)
RBC: 4.14 x10E6/uL (ref 3.77–5.28)
RDW: 13.7 % (ref 12.3–15.4)
WBC: 9 10*3/uL (ref 3.4–10.8)

## 2017-08-12 LAB — GLUCOSE TOLERANCE, 2 HOURS W/ 1HR
Glucose, 1 hour: 111 mg/dL (ref 65–179)
Glucose, 2 hour: 97 mg/dL (ref 65–152)
Glucose, Fasting: 77 mg/dL (ref 65–91)

## 2017-08-12 LAB — HIV ANTIBODY (ROUTINE TESTING W REFLEX): HIV SCREEN 4TH GENERATION: NONREACTIVE

## 2017-08-12 LAB — RPR: RPR Ser Ql: NONREACTIVE

## 2017-08-25 ENCOUNTER — Ambulatory Visit (INDEPENDENT_AMBULATORY_CARE_PROVIDER_SITE_OTHER): Payer: Medicaid Other | Admitting: Certified Nurse Midwife

## 2017-08-25 ENCOUNTER — Encounter: Payer: Self-pay | Admitting: Certified Nurse Midwife

## 2017-08-25 VITALS — BP 116/77 | HR 108 | Wt 165.0 lb

## 2017-08-25 DIAGNOSIS — Z23 Encounter for immunization: Secondary | ICD-10-CM

## 2017-08-25 DIAGNOSIS — Z3480 Encounter for supervision of other normal pregnancy, unspecified trimester: Secondary | ICD-10-CM

## 2017-08-25 DIAGNOSIS — O0932 Supervision of pregnancy with insufficient antenatal care, second trimester: Secondary | ICD-10-CM

## 2017-08-25 DIAGNOSIS — Z3483 Encounter for supervision of other normal pregnancy, third trimester: Secondary | ICD-10-CM

## 2017-08-25 DIAGNOSIS — O0933 Supervision of pregnancy with insufficient antenatal care, third trimester: Secondary | ICD-10-CM

## 2017-08-25 NOTE — Progress Notes (Signed)
Patient reports good fetal movement, denies pain. 

## 2017-08-25 NOTE — Progress Notes (Signed)
   PRENATAL VISIT NOTE  Subjective:  Stacey Clements is a 26 y.o. G3P2002 at 5351w5d being seen today for ongoing prenatal care.  She is currently monitored for the following issues for this low-risk pregnancy and has Supervision of other normal pregnancy, antepartum and Late prenatal care affecting pregnancy in second trimester on her problem list.  Patient reports no complaints.  Contractions: Not present. Vag. Bleeding: None.  Movement: Present. Denies leaking of fluid.   The following portions of the patient's history were reviewed and updated as appropriate: allergies, current medications, past family history, past medical history, past social history, past surgical history and problem list. Problem list updated.  Objective:   Vitals:   08/25/17 1553  BP: 116/77  Pulse: (!) 108  Weight: 165 lb (74.8 kg)    Fetal Status: Fetal Heart Rate (bpm): 147; doppler Fundal Height: 30 cm Movement: Present     General:  Alert, oriented and cooperative. Patient is in no acute distress.  Skin: Skin is warm and dry. No rash noted.   Cardiovascular: Normal heart rate noted  Respiratory: Normal respiratory effort, no problems with respiration noted  Abdomen: Soft, gravid, appropriate for gestational age.  Pain/Pressure: Absent     Pelvic: Cervical exam deferred        Extremities: Normal range of motion.  Edema: None  Mental Status:  Normal mood and affect. Normal behavior. Normal judgment and thought content.   Assessment and Plan:  Pregnancy: G3P2002 at 4351w5d  1. Supervision of other normal pregnancy, antepartum     Doing well.   - Tdap vaccine greater than or equal to 7yo IM - Flu Vaccine QUAD 36+ mos IM (Fluarix, Quad PF)  2. Late prenatal care affecting pregnancy in second trimester      @20  weeks.   Preterm labor symptoms and general obstetric precautions including but not limited to vaginal bleeding, contractions, leaking of fluid and fetal movement were reviewed in detail with the  patient. Please refer to After Visit Summary for other counseling recommendations.  Return in about 2 weeks (around 09/08/2017) for ROB.   Roe Coombsachelle A Morty Ortwein, CNM

## 2017-09-08 ENCOUNTER — Ambulatory Visit (INDEPENDENT_AMBULATORY_CARE_PROVIDER_SITE_OTHER): Payer: Medicaid Other | Admitting: Certified Nurse Midwife

## 2017-09-08 VITALS — BP 112/69 | HR 99 | Wt 166.9 lb

## 2017-09-08 DIAGNOSIS — Z3482 Encounter for supervision of other normal pregnancy, second trimester: Secondary | ICD-10-CM

## 2017-09-08 DIAGNOSIS — Z3483 Encounter for supervision of other normal pregnancy, third trimester: Secondary | ICD-10-CM

## 2017-09-08 DIAGNOSIS — Z3480 Encounter for supervision of other normal pregnancy, unspecified trimester: Secondary | ICD-10-CM

## 2017-09-08 DIAGNOSIS — O0932 Supervision of pregnancy with insufficient antenatal care, second trimester: Secondary | ICD-10-CM

## 2017-09-08 NOTE — Progress Notes (Signed)
   PRENATAL VISIT NOTE  Subjective:  Stacey Clements is a 26 y.o. G3P2002 at 809w5d being seen today for ongoing prenatal care.  She is currently monitored for the following issues for this low-risk pregnancy and has Supervision of other normal pregnancy, antepartum and Late prenatal care affecting pregnancy in second trimester on her problem list.  Patient reports no complaints.  Contractions: Not present. Vag. Bleeding: None.  Movement: Present. Denies leaking of fluid.   The following portions of the patient's history were reviewed and updated as appropriate: allergies, current medications, past family history, past medical history, past social history, past surgical history and problem list. Problem list updated.  Objective:   Vitals:   09/08/17 1555  BP: 112/69  Pulse: 99  Weight: 166 lb 14.4 oz (75.7 kg)    Fetal Status: Fetal Heart Rate (bpm): 150; doppler Fundal Height: 32 cm Movement: Present     General:  Alert, oriented and cooperative. Patient is in no acute distress.  Skin: Skin is warm and dry. No rash noted.   Cardiovascular: Normal heart rate noted  Respiratory: Normal respiratory effort, no problems with respiration noted  Abdomen: Soft, gravid, appropriate for gestational age.  Pain/Pressure: Absent     Pelvic: Cervical exam deferred        Extremities: Normal range of motion.  Edema: None  Mental Status:  Normal mood and affect. Normal behavior. Normal judgment and thought content.   Assessment and Plan:  Pregnancy: G3P2002 at [redacted]w[redacted]d  1. Supervision of other normal pregnancy, antepartum      Doing well  2. Late prenatal care affecting pregnancy in second trimester     @20  weeks  Preterm labor symptoms and general obstetric precautions including but not limited to vaginal bleeding, contractions, leaking of fluid and fetal movement were reviewed in detail with the patient. Please refer to After Visit Summary for other counseling recommendations.  Return in about 2  weeks (around 09/22/2017) for ROB.   Roe Coombsachelle A Dory Verdun, CNM

## 2017-09-22 ENCOUNTER — Encounter: Payer: Self-pay | Admitting: Certified Nurse Midwife

## 2017-09-22 ENCOUNTER — Ambulatory Visit (INDEPENDENT_AMBULATORY_CARE_PROVIDER_SITE_OTHER): Payer: Medicaid Other | Admitting: Certified Nurse Midwife

## 2017-09-22 VITALS — BP 112/69 | HR 110 | Wt 167.0 lb

## 2017-09-22 DIAGNOSIS — O0932 Supervision of pregnancy with insufficient antenatal care, second trimester: Secondary | ICD-10-CM

## 2017-09-22 DIAGNOSIS — O0933 Supervision of pregnancy with insufficient antenatal care, third trimester: Secondary | ICD-10-CM

## 2017-09-22 DIAGNOSIS — Z3480 Encounter for supervision of other normal pregnancy, unspecified trimester: Secondary | ICD-10-CM

## 2017-09-22 NOTE — Progress Notes (Signed)
   PRENATAL VISIT NOTE  Subjective:  Stacey Clements is a 26 y.o. G3P2002 at 735w5d being seen today for ongoing prenatal care.  She is currently monitored for the following issues for this low-risk pregnancy and has Supervision of other normal pregnancy, antepartum and Late prenatal care affecting pregnancy in second trimester on their problem list.  Patient reports no complaints.  Contractions: Not present. Vag. Bleeding: None.  Movement: Present. Denies leaking of fluid.   The following portions of the patient's history were reviewed and updated as appropriate: allergies, current medications, past family history, past medical history, past social history, past surgical history and problem list. Problem list updated.  Objective:   Vitals:   09/22/17 1551  BP: 112/69  Pulse: (!) 110  Weight: 167 lb (75.8 kg)    Fetal Status: Fetal Heart Rate (bpm): 147; doppler Fundal Height: 34 cm Movement: Present     General:  Alert, oriented and cooperative. Patient is in no acute distress.  Skin: Skin is warm and dry. No rash noted.   Cardiovascular: Normal heart rate noted  Respiratory: Normal respiratory effort, no problems with respiration noted  Abdomen: Soft, gravid, appropriate for gestational age.  Pain/Pressure: Present     Pelvic: Cervical exam deferred        Extremities: Normal range of motion.     Mental Status:  Normal mood and affect. Normal behavior. Normal judgment and thought content.   Assessment and Plan:  Pregnancy: G3P2002 at 6535w5d  1. Supervision of other normal pregnancy, antepartum     Doing well.   2. Late prenatal care affecting pregnancy in second trimester     @20  weeks.   Preterm labor symptoms and general obstetric precautions including but not limited to vaginal bleeding, contractions, leaking of fluid and fetal movement were reviewed in detail with the patient. Please refer to After Visit Summary for other counseling recommendations.  Return in about 1 week  (around 09/29/2017) for ROB, GBS.   Roe Coombsachelle A Denney, CNM

## 2017-09-29 ENCOUNTER — Encounter: Payer: Medicaid Other | Admitting: Nurse Practitioner

## 2017-10-11 ENCOUNTER — Other Ambulatory Visit (HOSPITAL_COMMUNITY)
Admission: RE | Admit: 2017-10-11 | Discharge: 2017-10-11 | Disposition: A | Discharge status: Home / Self Care | Payer: Medicaid Other | Source: Ambulatory Visit | Attending: Certified Nurse Midwife | Admitting: Certified Nurse Midwife

## 2017-10-11 ENCOUNTER — Encounter: Payer: Self-pay | Admitting: Certified Nurse Midwife

## 2017-10-11 ENCOUNTER — Ambulatory Visit (INDEPENDENT_AMBULATORY_CARE_PROVIDER_SITE_OTHER): Payer: Medicaid Other | Admitting: Certified Nurse Midwife

## 2017-10-11 DIAGNOSIS — Z3480 Encounter for supervision of other normal pregnancy, unspecified trimester: Secondary | ICD-10-CM

## 2017-10-11 DIAGNOSIS — Z3A37 37 weeks gestation of pregnancy: Secondary | ICD-10-CM | POA: Diagnosis not present

## 2017-10-11 DIAGNOSIS — Z3483 Encounter for supervision of other normal pregnancy, third trimester: Secondary | ICD-10-CM | POA: Insufficient documentation

## 2017-10-11 NOTE — Progress Notes (Signed)
   PRENATAL VISIT NOTE  Subjective:  Stacey Clements is a 26 y.o. G3P2002 at 2660w3d being seen today for ongoing prenatal care.  She is currently monitored for the following issues for this low-risk pregnancy and has Supervision of other normal pregnancy, antepartum and Late prenatal care affecting pregnancy in second trimester on their problem list.  Patient reports no complaints.  Contractions: Irregular. Vag. Bleeding: None.  Movement: Present. Denies leaking of fluid.   The following portions of the patient's history were reviewed and updated as appropriate: allergies, current medications, past family history, past medical history, past social history, past surgical history and problem list. Problem list updated.  Objective:   Vitals:   10/11/17 1614  BP: 104/69  Pulse: 96  Weight: 169 lb 9.6 oz (76.9 kg)    Fetal Status: Fetal Heart Rate (bpm): 158; doppler Fundal Height: 37 cm Movement: Present  Presentation: Vertex  General:  Alert, oriented and cooperative. Patient is in no acute distress.  Skin: Skin is warm and dry. No rash noted.   Cardiovascular: Normal heart rate noted  Respiratory: Normal respiratory effort, no problems with respiration noted  Abdomen: Soft, gravid, appropriate for gestational age.  Pain/Pressure: Absent     Pelvic: Cervical exam performed Dilation: 1.5 Effacement (%): 60 Station: -3, posterior  Extremities: Normal range of motion.  Edema: None  Mental Status:  Normal mood and affect. Normal behavior. Normal judgment and thought content.   Assessment and Plan:  Pregnancy: G3P2002 at 26960w3d  1. Supervision of other normal pregnancy, antepartum     Doing well - Strep Gp B NAA - Cervicovaginal ancillary only  Term labor symptoms and general obstetric precautions including but not limited to vaginal bleeding, contractions, leaking of fluid and fetal movement were reviewed in detail with the patient. Please refer to After Visit Summary for other counseling  recommendations.  Return in about 1 week (around 10/18/2017) for ROB.   Roe Coombsachelle A Shamar Engelmann, CNM

## 2017-10-11 NOTE — Progress Notes (Signed)
Patient reports good fetal movement with irregular contractions.  

## 2017-10-12 LAB — CERVICOVAGINAL ANCILLARY ONLY
BACTERIAL VAGINITIS: NEGATIVE
CHLAMYDIA, DNA PROBE: NEGATIVE
Candida vaginitis: NEGATIVE
NEISSERIA GONORRHEA: NEGATIVE
Trichomonas: NEGATIVE

## 2017-10-13 ENCOUNTER — Other Ambulatory Visit: Payer: Self-pay | Admitting: Certified Nurse Midwife

## 2017-10-13 DIAGNOSIS — Z3480 Encounter for supervision of other normal pregnancy, unspecified trimester: Secondary | ICD-10-CM

## 2017-10-13 LAB — STREP GP B NAA: STREP GROUP B AG: NEGATIVE

## 2017-10-17 ENCOUNTER — Encounter (HOSPITAL_COMMUNITY): Payer: Self-pay

## 2017-10-17 ENCOUNTER — Inpatient Hospital Stay (HOSPITAL_COMMUNITY): Payer: Medicaid Other | Admitting: Anesthesiology

## 2017-10-17 ENCOUNTER — Inpatient Hospital Stay (HOSPITAL_COMMUNITY)
Admission: AD | Admit: 2017-10-17 | Discharge: 2017-10-18 | DRG: 807 | Disposition: A | Discharge status: Home / Self Care | Payer: Medicaid Other | Source: Ambulatory Visit | Attending: Obstetrics and Gynecology | Admitting: Obstetrics and Gynecology

## 2017-10-17 DIAGNOSIS — Z3480 Encounter for supervision of other normal pregnancy, unspecified trimester: Secondary | ICD-10-CM

## 2017-10-17 DIAGNOSIS — Z3483 Encounter for supervision of other normal pregnancy, third trimester: Secondary | ICD-10-CM | POA: Diagnosis present

## 2017-10-17 DIAGNOSIS — Z3A38 38 weeks gestation of pregnancy: Secondary | ICD-10-CM | POA: Diagnosis not present

## 2017-10-17 LAB — CBC
HEMATOCRIT: 36.1 % (ref 36.0–46.0)
HEMOGLOBIN: 11.5 g/dL — AB (ref 12.0–15.0)
MCH: 25.6 pg — ABNORMAL LOW (ref 26.0–34.0)
MCHC: 31.9 g/dL (ref 30.0–36.0)
MCV: 80.4 fL (ref 78.0–100.0)
Platelets: 179 10*3/uL (ref 150–400)
RBC: 4.49 MIL/uL (ref 3.87–5.11)
RDW: 14.2 % (ref 11.5–15.5)
WBC: 12.2 10*3/uL — AB (ref 4.0–10.5)

## 2017-10-17 LAB — TYPE AND SCREEN
ABO/RH(D): O POS
ANTIBODY SCREEN: NEGATIVE

## 2017-10-17 MED ORDER — ONDANSETRON HCL 4 MG PO TABS: 4.0000 mg | ORAL_TABLET | ORAL | Status: DC | PRN

## 2017-10-17 MED ORDER — SENNOSIDES-DOCUSATE SODIUM 8.6-50 MG PO TABS: 2.0000 | ORAL_TABLET | ORAL | Status: DC

## 2017-10-17 MED ORDER — LACTATED RINGERS IV SOLN: 500.0000 mL | Freq: Once | INTRAVENOUS | Status: DC

## 2017-10-17 MED ORDER — WITCH HAZEL-GLYCERIN EX PADS: 1.0000 "application " | MEDICATED_PAD | CUTANEOUS | Status: DC | PRN

## 2017-10-17 MED ORDER — IBUPROFEN 600 MG PO TABS
600.0000 mg | ORAL_TABLET | Freq: Four times a day (QID) | ORAL | Status: DC
  Filled 2017-10-17 (×2): qty 1

## 2017-10-17 MED ORDER — COCONUT OIL OIL: 1.0000 "application " | TOPICAL_OIL | Status: DC | PRN

## 2017-10-17 MED ORDER — BENZOCAINE-MENTHOL 20-0.5 % EX AERO
1.0000 "application " | INHALATION_SPRAY | CUTANEOUS | Status: DC | PRN
  Filled 2017-10-17: qty 56

## 2017-10-17 MED ORDER — EPHEDRINE 5 MG/ML INJ
10.0000 mg | INTRAVENOUS | Status: DC | PRN
  Filled 2017-10-17: qty 2

## 2017-10-17 MED ORDER — LACTATED RINGERS IV SOLN
INTRAVENOUS | Status: DC
  Administered 2017-10-17: 125 mL/h via INTRAVENOUS

## 2017-10-17 MED ORDER — ONDANSETRON HCL 4 MG/2ML IJ SOLN: 4.0000 mg | INTRAMUSCULAR | Status: DC | PRN

## 2017-10-17 MED ORDER — FLEET ENEMA 7-19 GM/118ML RE ENEM: 1.0000 | ENEMA | RECTAL | Status: DC | PRN

## 2017-10-17 MED ORDER — ACETAMINOPHEN 325 MG PO TABS: 650.0000 mg | ORAL_TABLET | ORAL | Status: DC | PRN

## 2017-10-17 MED ORDER — OXYCODONE-ACETAMINOPHEN 5-325 MG PO TABS: 2.0000 | ORAL_TABLET | ORAL | Status: DC | PRN

## 2017-10-17 MED ORDER — DIPHENHYDRAMINE HCL 50 MG/ML IJ SOLN: 12.5000 mg | INTRAMUSCULAR | Status: DC | PRN

## 2017-10-17 MED ORDER — TERBUTALINE SULFATE 1 MG/ML IJ SOLN
0.2500 mg | Freq: Once | INTRAMUSCULAR | Status: DC | PRN
  Filled 2017-10-17: qty 1

## 2017-10-17 MED ORDER — ZOLPIDEM TARTRATE 5 MG PO TABS: 5.0000 mg | ORAL_TABLET | Freq: Every evening | ORAL | Status: DC | PRN

## 2017-10-17 MED ORDER — OXYCODONE-ACETAMINOPHEN 5-325 MG PO TABS: 1.0000 | ORAL_TABLET | ORAL | Status: DC | PRN

## 2017-10-17 MED ORDER — DIBUCAINE 1 % RE OINT: 1.0000 "application " | TOPICAL_OINTMENT | RECTAL | Status: DC | PRN

## 2017-10-17 MED ORDER — LACTATED RINGERS IV SOLN: 500.0000 mL | INTRAVENOUS | Status: DC | PRN

## 2017-10-17 MED ORDER — PHENYLEPHRINE 40 MCG/ML (10ML) SYRINGE FOR IV PUSH (FOR BLOOD PRESSURE SUPPORT)
80.0000 ug | PREFILLED_SYRINGE | INTRAVENOUS | Status: DC | PRN
  Filled 2017-10-17: qty 5

## 2017-10-17 MED ORDER — PRENATAL MULTIVITAMIN CH
1.0000 | ORAL_TABLET | Freq: Every day | ORAL | Status: DC
  Administered 2017-10-18: 1 via ORAL
  Filled 2017-10-17: qty 1

## 2017-10-17 MED ORDER — FENTANYL CITRATE (PF) 100 MCG/2ML IJ SOLN: 100.0000 ug | INTRAMUSCULAR | Status: DC | PRN

## 2017-10-17 MED ORDER — OXYTOCIN 40 UNITS IN LACTATED RINGERS INFUSION - SIMPLE MED
1.0000 m[IU]/min | INTRAVENOUS | Status: DC
  Administered 2017-10-17: 2 m[IU]/min via INTRAVENOUS

## 2017-10-17 MED ORDER — DIPHENHYDRAMINE HCL 25 MG PO CAPS: 25.0000 mg | ORAL_CAPSULE | Freq: Four times a day (QID) | ORAL | Status: DC | PRN

## 2017-10-17 MED ORDER — FENTANYL 2.5 MCG/ML BUPIVACAINE 1/10 % EPIDURAL INFUSION (WH - ANES)
14.0000 mL/h | INTRAMUSCULAR | Status: DC | PRN
  Filled 2017-10-17: qty 100

## 2017-10-17 MED ORDER — LIDOCAINE HCL (PF) 1 % IJ SOLN
30.0000 mL | INTRAMUSCULAR | Status: DC | PRN
  Filled 2017-10-17: qty 30

## 2017-10-17 MED ORDER — ACETAMINOPHEN 325 MG PO TABS
650.0000 mg | ORAL_TABLET | ORAL | Status: DC | PRN
  Administered 2017-10-17: 650 mg via ORAL
  Filled 2017-10-17 (×2): qty 2

## 2017-10-17 MED ORDER — SIMETHICONE 80 MG PO CHEW: 80.0000 mg | CHEWABLE_TABLET | ORAL | Status: DC | PRN

## 2017-10-17 MED ORDER — OXYTOCIN 40 UNITS IN LACTATED RINGERS INFUSION - SIMPLE MED
2.5000 [IU]/h | INTRAVENOUS | Status: DC
  Filled 2017-10-17: qty 1000

## 2017-10-17 MED ORDER — PHENYLEPHRINE 40 MCG/ML (10ML) SYRINGE FOR IV PUSH (FOR BLOOD PRESSURE SUPPORT)
80.0000 ug | PREFILLED_SYRINGE | INTRAVENOUS | Status: DC | PRN
  Filled 2017-10-17: qty 5
  Filled 2017-10-17: qty 10

## 2017-10-17 MED ORDER — OXYTOCIN BOLUS FROM INFUSION
500.0000 mL | Freq: Once | INTRAVENOUS | Status: AC
  Administered 2017-10-17: 500 mL via INTRAVENOUS

## 2017-10-17 MED ORDER — SOD CITRATE-CITRIC ACID 500-334 MG/5ML PO SOLN: 30.0000 mL | ORAL | Status: DC | PRN

## 2017-10-17 MED ORDER — ONDANSETRON HCL 4 MG/2ML IJ SOLN
4.0000 mg | Freq: Four times a day (QID) | INTRAMUSCULAR | Status: DC | PRN
Start: 2017-10-17 — End: 2017-10-17

## 2017-10-17 MED ORDER — TETANUS-DIPHTH-ACELL PERTUSSIS 5-2.5-18.5 LF-MCG/0.5 IM SUSP: 0.5000 mL | Freq: Once | INTRAMUSCULAR | Status: DC

## 2017-10-17 NOTE — H&P (Signed)
LABOR AND DELIVERY ADMISSION HISTORY AND PHYSICAL NOTE  Stacey Clements is a 26 y.o. female 543P2002 with IUP at 4172w2d by LMP presenting for active labor.  She reports positive fetal movement. She denies leakage of fluid or vaginal bleeding.   She was having contractions 3-4 minutes and was dilated to 5 in MAU exam.  Prenatal History/Complications: PNC at [place] Pregnancy complications:  - n/a  Clinic     [facility] Prenatal Labs  Dating    LMP Blood type: O/Positive/-- (08/07 1437)   Genetic Screen AFP: Neg   NIPS:Mat21:neg Antibody:Negative (08/07 1437)  Anatomic US Female fetus; f/u anatomy/growth normal Rubella: 6.70 (08/07 1437)  GTT A1C:normal               Third trimester: WNL RPR: Non Reactive (10/03 1010)   Flu vaccine 08-25-17  HBsAg: Negative (08/07 1437)   TDaP vaccine  08-25-17                                   Rhogam:n/a O+ HIV:   NR  Baby Food              breast                                 ZOX:WRUEAVWUGBS:Negative (12/03 1652)  Contraception      nexplanon outp (decided during admission) Pap:06/15/17: negative  Circumcision         no CF: neg  Pediatrician       [place] Hemoglobinopathy: normal  Support Person        Mark- FOB     Past Medical History: Past Medical History:  Diagnosis Date  . Medical history non-contributory     Past Surgical History: Past Surgical History:  Procedure Laterality Date  . NO PAST SURGERIES      Obstetrical History: OB History    Gravida Para Term Preterm AB Living   3 2 2     2    SAB TAB Ectopic Multiple Live Births         0 2      Social History: Social History   Socioeconomic History  . Marital status: Single    Spouse name: None  . Number of children: None  . Years of education: None  . Highest education level: None  Social Needs  . Financial resource strain: None  . Food insecurity - worry: None  . Food insecurity - inability: None  . Transportation needs - medical: None  . Transportation needs - non-medical: None   Occupational History  . None  Tobacco Use  . Smoking status: Never Smoker  . Smokeless tobacco: Never Used  Substance and Sexual Activity  . Alcohol use: No  . Drug use: No  . Sexual activity: Yes    Birth control/protection: None  Other Topics Concern  . None  Social History Narrative  . None    Family History: History reviewed. No pertinent family history.  Allergies: No Known Allergies  Medications Prior to Admission  Medication Sig Dispense Refill Last Dose  . Prenatal MV-Min-FA-Omega-3 (PRENATAL GUMMIES/DHA & FA) 0.4-32.5 MG CHEW Chew by mouth.   10/16/2017 at Unknown time  . Prenatal Vit-Fe Fumarate-FA (PRENATAL MULTIVITAMIN) TABS tablet Take 1 tablet by mouth daily at 12 noon.   Taking     Review of Systems  All systems reviewed  and negative except as stated in HPI  Physical Exam Blood pressure 120/71, pulse (!) 112, temperature 98.2 F (36.8 C), temperature source Oral, resp. rate 18, last menstrual period 01/22/2017, SpO2 99 %, currently breastfeeding. General appearance: alert, cooperative, appears stated age and no distress Lungs: no respiratory distress/IWB Heart: regular rate, distal pulses intact to LE bilaterally Abdomen: soft, non-tender; fetal motion palpable Extremities: No calf swelling or tenderness Presentation: cephalic Fetal monitoring: cat 1 Uterine activity: contractions 3-4 minutes, uncomfortable Dilation: 5 Effacement (%): 80, 90 Station: -1 Exam by:: Camelia Enganielle Simpson RN  Prenatal labs: ABO, Rh: O/Positive/-- (08/07 1437) Antibody: Negative (08/07 1437) Rubella: 6.70 (08/07 1437) RPR: Non Reactive (10/03 1010)  HBsAg: Negative (08/07 1437)  HIV:   nr GC/Chlamydia: neg GBS: Negative (12/03 1652)  1 hr Glucola: wnl Genetic screening:  afp neg Anatomy US: no abnormalities noted  Prenatal Transfer Tool  Maternal Diabetes: No Genetic Screening: Normal Maternal Ultrasounds/Referrals: Normal Fetal Ultrasounds or other Referrals:   None Maternal Substance Abuse:  No Significant Maternal Medications:  None Significant Maternal Lab Results: None  No results found for this or any previous visit (from the past 24 hour(s)).  Patient Active Problem List   Diagnosis Date Noted  . Supervision of other normal pregnancy, antepartum 06/15/2017  . Late prenatal care affecting pregnancy in second trimester 06/15/2017    Assessment: Stacey Clements is a 26 y.o. G3P2002 at 5027w2d here for SOL  #Labor: expectant management, plan to offer pitocin #Pain: Per maternal request (plans natural, is aware of pain control options) #FWB: Cat  #ID:  gbs neg #MOF: breast #MOC:nexplanon outp #Circ:  N/a girl  Marthenia RollingScott Bland 10/17/2017, 3:33 AM   OB FELLOW HISTORY AND PHYSICAL ATTESTATION  I confirm that I have verified the information documented in the resident's note and that I have also personally reperformed the physical exam and all medical decision making activities. I agree with above documentation and have made edits as needed.   [employer]mber Farrah Skoda OB Fellow 10/17/2017, 6:23 AM

## 2017-10-17 NOTE — MAU Note (Signed)
Pt reports contractions every 4 mins. Pt denies LOF or vaginal bleeding. Reports good fetal movement. States cervix was 1.5cm on Wednesday.

## 2017-10-17 NOTE — Anesthesia Preprocedure Evaluation (Signed)

## 2017-10-18 ENCOUNTER — Encounter: Payer: Medicaid Other | Admitting: Certified Nurse Midwife

## 2017-10-18 LAB — RPR: RPR: NONREACTIVE

## 2017-10-18 NOTE — Anesthesia Postprocedure Evaluation (Signed)
Anesthesia Post Note  Patient: Stacey Clements  Procedure(s) Performed: AN AD HOC LABOR EPIDURAL     Patient location during evaluation: Mother Baby Anesthesia Type: Epidural Level of consciousness: awake, awake and alert and oriented Pain management: pain level controlled Vital Signs Assessment: post-procedure vital signs reviewed and stable Respiratory status: spontaneous breathing, nonlabored ventilation and respiratory function stable Cardiovascular status: stable Postop Assessment: no headache, no backache, no apparent nausea or vomiting, adequate PO intake and patient able to bend at knees Anesthetic complications: no    Last Vitals:  Vitals:   10/17/17 2104 10/18/17 0548  BP: 101/64 98/86  Pulse: 94 92  Resp: 16 16  Temp: 37.2 C 36.8 C  SpO2:      Last Pain:  Vitals:   10/18/17 0710  TempSrc:   PainSc: 0-No pain   Pain Goal:                 Stacey Clements

## 2017-10-18 NOTE — Discharge Summary (Signed)
OB Discharge Summary     Patient Name: Stacey SchimkeHnhoa Cannan DOB: 03/04/1991 MRN: [id number]  Date of admission: 10/17/2017 Delivering MD: Marthenia RollingBLAND, Chael Urenda   Date of discharge: 10/18/2017  Admitting diagnosis: 38 wks ctx every 4 to 5 min and pain Intrauterine pregnancy: 6843w2d     Secondary diagnosis:  Active Problems:   SVD (spontaneous vaginal delivery)  Additional problems:      Discharge diagnosis: Term Pregnancy Delivered                                                                                                Post partum procedures:n/a  Augmentation: Pitocin  Complications: None  Hospital course:  Induction of Labor With Vaginal Delivery   26 y.o. yo G3P3003 at 5843w2d was admitted to the hospital 10/17/2017 for induction of labor.  Indication for induction: Favorable cervix at term.  Patient had an uncomplicated labor course as follows: Membrane Rupture Time/Date: 4:21 AM ,10/17/2017   Intrapartum Procedures: Episiotomy: None [1]                                         Lacerations:  1st degree [2]  Patient had delivery of a Viable infant.  Information for the patient's newborn:  Stacey Clements, Girl Diara [[id number]][[id number]]  Delivery Method: Vaginal, Spontaneous(Filed from Delivery Summary)   10/17/2017  Details of delivery can be found in separate delivery note.  Patient had a routine postpartum course. Patient is discharged home 10/18/17.  Physical exam  Vitals:   10/17/17 1000 10/17/17 1433 10/17/17 2104 10/18/17 0548  BP: 117/63 (!) 95/57 101/64 98/86  Pulse: 88 82 94 92  Resp: 16 16 16 16   Temp: 98.4 F (36.9 C) 100 F (37.8 C) 98.9 F (37.2 C) 98.3 F (36.8 C)  TempSrc: Oral Oral Oral Oral  SpO2: 100% 98%    Weight:    71.3 kg (157 lb 3.2 oz)   General: alert, cooperative and no distress Lochia: appropriate Uterine Fundus: firm Incision: N/A DVT Evaluation: No evidence of DVT seen on physical exam. Labs: Lab Results  Component Value Date   WBC 12.2 (H) 10/17/2017   HGB 11.5  (L) 10/17/2017   HCT 36.1 10/17/2017   MCV 80.4 10/17/2017   PLT 179 10/17/2017   CMP Latest Ref Rng & Units 10/31/2014  Glucose 70 - 99 mg/dL 91  BUN 6 - 23 mg/dL 11  Creatinine 7.820.50 - 9.561.10 mg/dL 2.13(Y0.46(L)  Sodium 865135 - 784145 mmol/L 136  Potassium 3.5 - 5.1 mmol/L 3.7  Chloride 96 - 112 mEq/L 106  CO2 19 - 32 mmol/L 22  Calcium 8.4 - 10.5 mg/dL 8.7  Total Protein 6.0 - 8.3 g/dL 7.0  Total Bilirubin 0.3 - 1.2 mg/dL 0.5  Alkaline Phos 39 - 117 U/L 108  AST 0 - 37 U/L 19  ALT 0 - 35 U/L 13    Discharge instruction: per After Visit Summary and "Baby and Me Booklet".  After visit meds:  Allergies as of 10/18/2017   No  Known Allergies     Medication List    TAKE these medications   prenatal multivitamin Tabs tablet Take 1 tablet by mouth daily at 12 noon.       Diet: routine diet  Activity: Advance as tolerated. Pelvic rest for 6 weeks.   Outpatient follow up:6 weeks Follow up Appt: Future Appointments  Date Time Provider Department Center  11/16/2017  8:30 AM Roe Coombsenney, Rachelle A, CNM [facility] None   Follow up Visit:No Follow-up on file.  Postpartum contraception: Nexplanon  Newborn Data: Live born female  Birth Weight: 7 lb 1.9 oz (3230 g) APGAR: 9, 9  Newborn Delivery   Birth date/time:  10/17/2017 06:44:00 Delivery type:  Vaginal, Spontaneous     Baby Feeding: Breast Disposition:home with mother   10/18/2017 Marthenia RollingScott Lynnmarie Lovett, DO

## 2017-11-16 ENCOUNTER — Ambulatory Visit (INDEPENDENT_AMBULATORY_CARE_PROVIDER_SITE_OTHER): Payer: Medicaid Other | Admitting: Certified Nurse Midwife

## 2017-11-16 ENCOUNTER — Encounter: Payer: Self-pay | Admitting: *Deleted

## 2017-11-16 ENCOUNTER — Encounter: Payer: Self-pay | Admitting: Certified Nurse Midwife

## 2017-11-16 DIAGNOSIS — Z3202 Encounter for pregnancy test, result negative: Secondary | ICD-10-CM

## 2017-11-16 DIAGNOSIS — Z3049 Encounter for surveillance of other contraceptives: Secondary | ICD-10-CM

## 2017-11-16 DIAGNOSIS — Z30017 Encounter for initial prescription of implantable subdermal contraceptive: Secondary | ICD-10-CM

## 2017-11-16 DIAGNOSIS — Z1389 Encounter for screening for other disorder: Secondary | ICD-10-CM

## 2017-11-16 LAB — POCT URINE PREGNANCY: Preg Test, Ur: NEGATIVE

## 2017-11-16 MED ORDER — ETONOGESTREL 68 MG ~~LOC~~ IMPL
68.0000 mg | DRUG_IMPLANT | Freq: Once | SUBCUTANEOUS | Status: AC
  Administered 2017-11-16: 68 mg via SUBCUTANEOUS

## 2017-11-16 NOTE — Progress Notes (Signed)
.  Post Partum Exam  Stacey Clements is a 27 y.o. 623P3003 female who presents for a postpartum visit. She is 4 weeks postpartum following a spontaneous vaginal delivery. I have fully reviewed the prenatal and intrapartum course. The delivery was at 38.2 gestational weeks.  Anesthesia: epidural. Postpartum course has been good. Baby's course has been good. Baby is feeding by breast. Bleeding no bleeding. Bowel function is abnormal: constipated. Bladder function is normal. Patient is not sexually active. Contraception method is none. Postpartum depression screening:neg  The following portions of the patient's history were reviewed and updated as appropriate: allergies, current medications, past family history, past medical history, past social history, past surgical history and problem list.  Review of Systems Pertinent items noted in HPI and remainder of comprehensive ROS otherwise negative.    Objective:  unknown if currently breastfeeding.  General:  alert, cooperative and no distress   Breasts:  inspection negative, no nipple discharge or bleeding, no masses or nodularity palpable  Lungs: clear to auscultation bilaterally  Heart:  regular rate and rhythm, S1, S2 normal, no murmur, click, rub or gallop  Abdomen: soft, non-tender; bowel sounds normal; no masses,  no organomegaly  Pelvic/Rectal Exam: Not performed.       Pap Smear: 06/15/17: normal  Assessment:    Normal 4 week postpartum exam. Pap smear not done at today's visit.    Plan:   1. Contraception: abstinence and Nexplanon 2. Nexplanon placed today.  Pap Smear up to date 3. Follow up in: 8 months for annual exam or as needed.

## 2017-11-16 NOTE — Progress Notes (Signed)
[facility]

## 2017-11-16 NOTE — Progress Notes (Signed)
Nexplanon Procedure Note   PRE-OP DIAGNOSIS: desired long-term, reversible contraception  POST-OP DIAGNOSIS: Same  PROCEDURE: Nexplanon  placement Performing Provider: Denna Fryberger CNM   Patient education prior to procedure, explained risk, benefits of Nexplanon, reviewed alternative options. Patient reported understanding. Gave consent to continue with procedure.   PROCEDURE:  Pregnancy Text :  Negative Site (check):      left arm         Sterile Preparation:   Betadinex3 Lot # [id number]  [id number] Expiration Date 03/2020  Insertion site was selected 8 - 10 cm from medial epicondyle and marked along with guiding site using sterile marker. Procedure area was prepped and draped in a sterile fashion. 1% Lidocaine 1.5 ml given prior to procedure. Nexplanon  was inserted subcutaneously.Needle was removed from the insertion site. Nexplanon capsule was palpated by provider and patient to assure satisfactory placement. And a bandage applied and the arm was wrapped with gauze bandage.     Followup: The patient tolerated the procedure well without complications.  Instructions:  The patient was instructed to remove the dressing in 24 hours and that some bruising is to be expected.  She was advised to use over the counter analgesics as needed for any pain at the site.  She is to keep the area dry for 24 hours and to call if her hand or arm becomes cold, numb, or blue.   Stacey Clements CNM   

## 2019-01-14 IMAGING — US US MFM OB COMP +14 WKS
1 series · 14 of 28 positions shown · non-contrast
Comparison: none

[Series 1: us mfm ob comp +14 wks · 97 acquisitions, 14 frames shown]
[im 4/97]
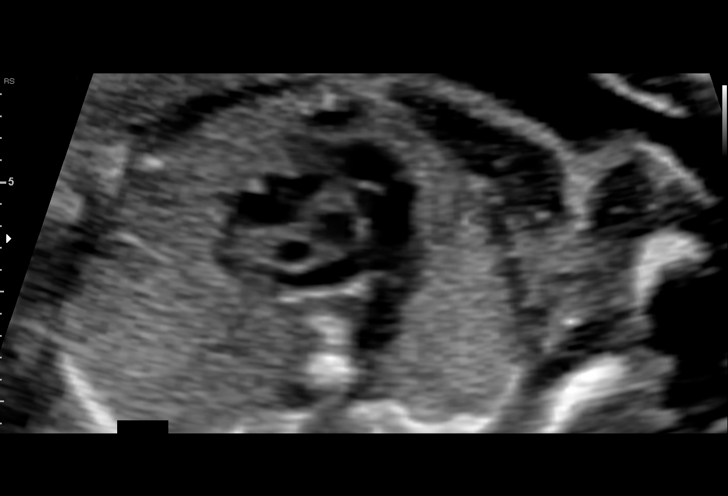
[im 11/97]
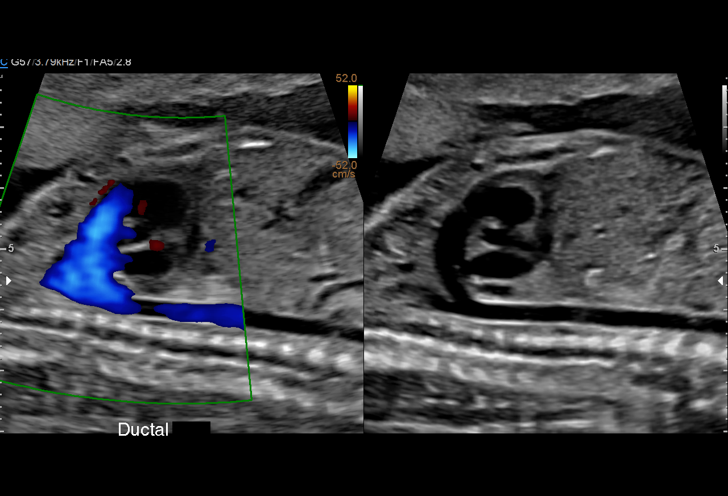
[im 18/97]
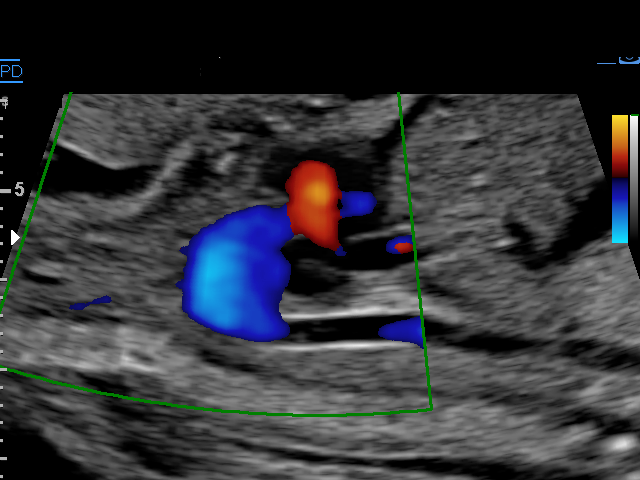
[im 25/97]
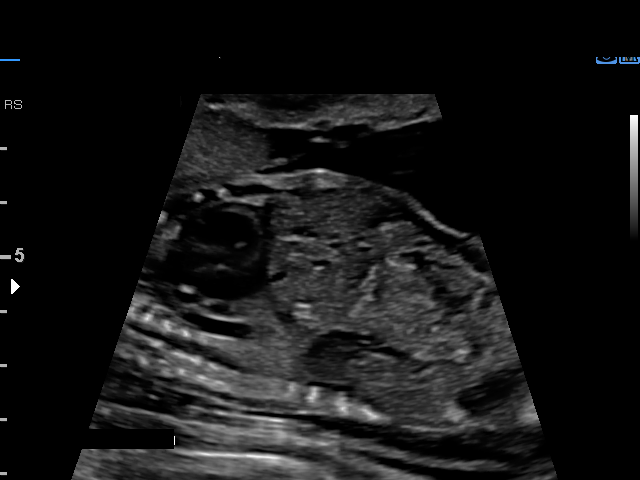
[im 33/97]
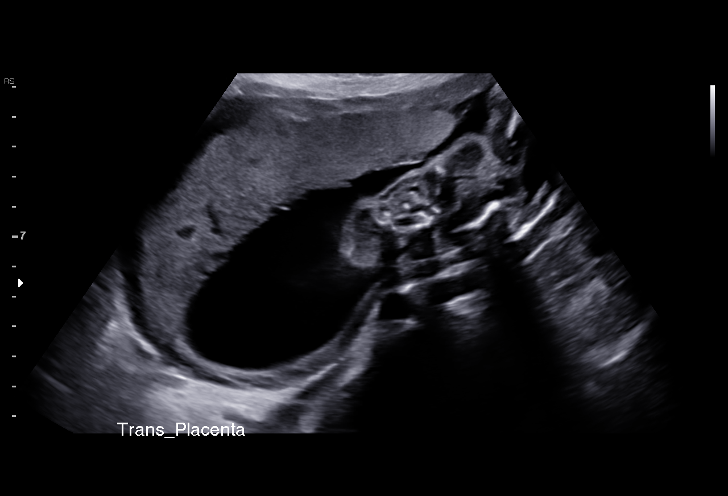
[im 40/97]
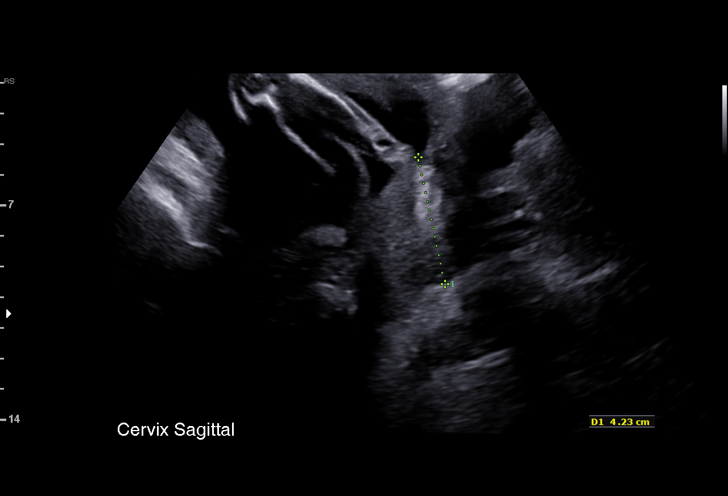
[im 47/97]
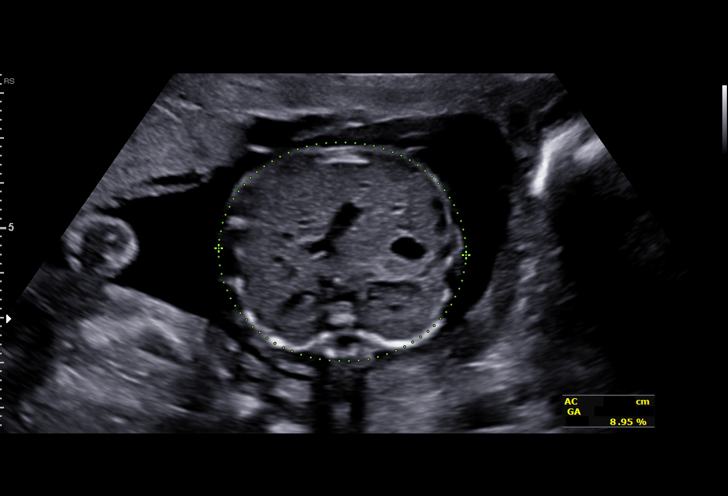
[im 54/97]
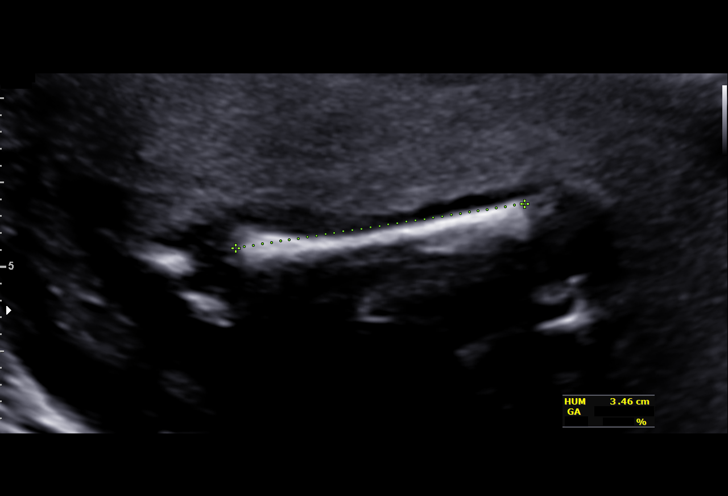
[im 61/97]
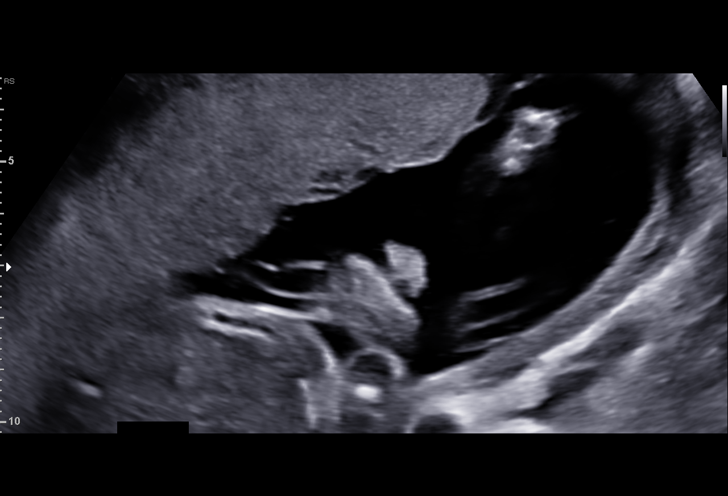
[im 68/97]
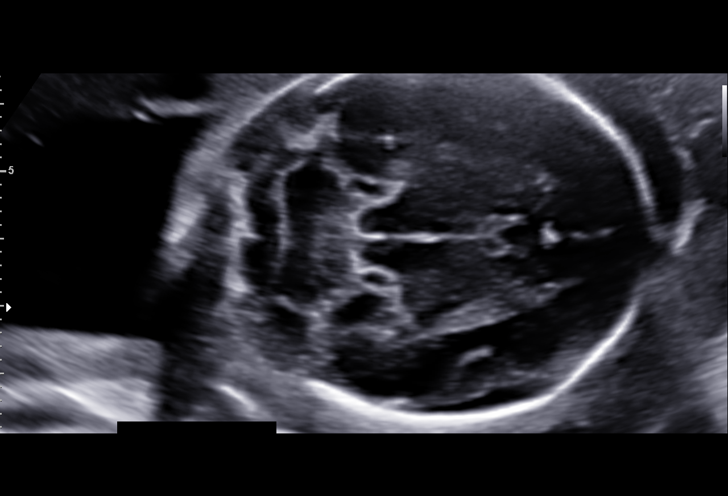
[im 75/97]
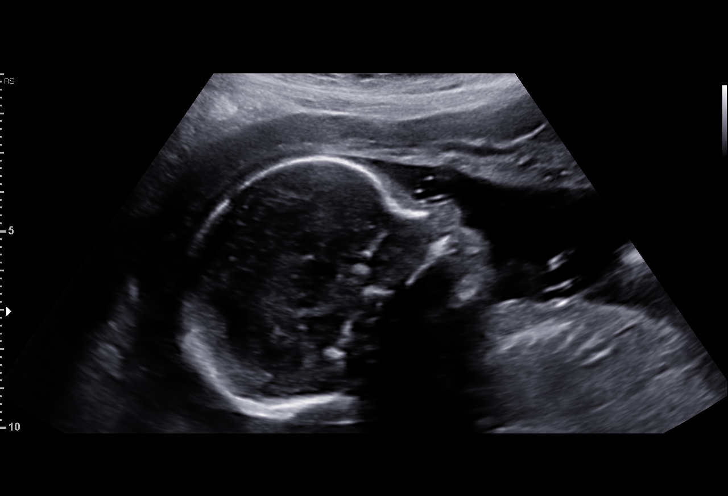
[im 82/97]
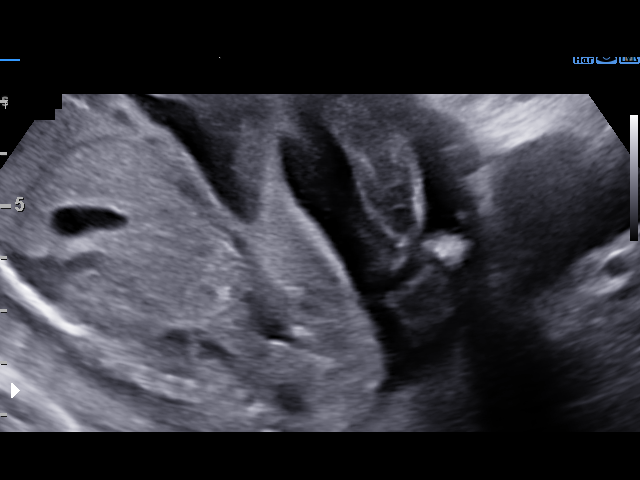
[im 89/97]
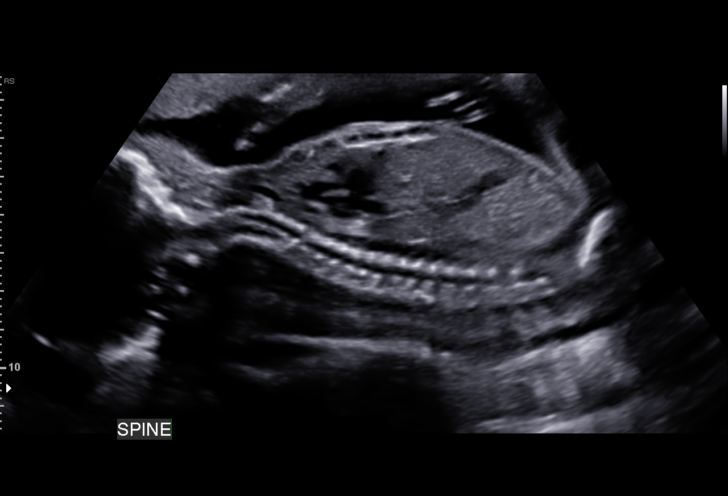
[im 97/97]
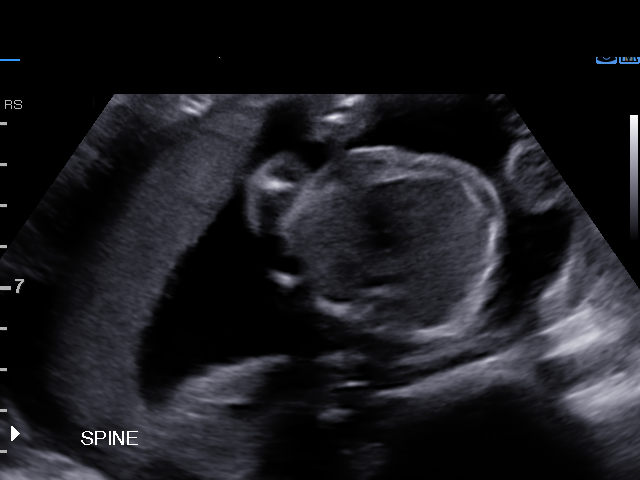

[14 of 28 positions shown; findings below may reference images not displayed]

Road [HOSPITAL]

Indications

21 weeks gestation of pregnancy
Encounter for antenatal screening for
malformations
Late prenatal care, second trimester
Encounter for uncertain dates
Encounter for fetal anatomic survey
OB History

Gravidity:    3         Term:   2
Living:       2
Fetal Evaluation

Num Of Fetuses:     1
Fetal Heart         151
Rate(bpm):
Cardiac Activity:   Observed
Presentation:       Breech
Placenta:           Fundal, above cervical os
P. Cord Insertion:  Visualized, central

Amniotic Fluid
AFI FV:      Subjectively within normal limits

Largest Pocket(cm)
6.16
Biometry

BPD:        54  mm     G. Age:  22w 3d         70  %    CI:        73.39   %   70 - 86
FL/HC:      17.5   %   18.4 -
HC:      200.3  mm     G. Age:  22w 2d         53  %    HC/AC:      1.21       1.06 -
AC:      165.7  mm     G. Age:  21w 4d         35  %    FL/BPD:     65.0   %   71 - 87
FL:       35.1  mm     G. Age:  21w 0d         18  %    FL/AC:      21.2   %   20 - 24
HUM:      34.8  mm     G. Age:  21w 6d         51  %
CER:      23.9  mm     G. Age:  22w 0d         53  %
CM:        5.7  mm

Est. FW:     428  gm    0 lb 15 oz      35  %
Gestational Age

LMP:           22w 5d       Date:   01/22/17                 EDD:   10/29/17
U/S Today:     21w 6d                                        EDD:   11/04/17
Best:          21w 6d    Det. By:   U/S (06/30/17)           EDD:   11/04/17
Anatomy

Cranium:               Appears normal         Aortic Arch:            Appears normal
Cavum:                 Appears normal         Ductal Arch:            Appears normal
Ventricles:            Appears normal         Diaphragm:              Appears normal
Choroid Plexus:        Appears normal         Stomach:                Appears normal, left
sided
Cerebellum:            Appears normal         Abdomen:                Appears normal
Posterior Fossa:       Appears normal         Abdominal Wall:         Appears nml (cord
insert, abd wall)
Nuchal Fold:           Not applicable (>20    Cord Vessels:           Appears normal (3
wks GA)                                        vessel cord)
Face:                  Orbits nl; profile not Kidneys:                Appear normal
well visualized
Lips:                  Appears normal         Bladder:                Appears normal
Thoracic:              Appears normal         Spine:                  Not well visualized
Heart:                 Appears normal         Upper Extremities:      Appears normal
(4CH, axis, and
situs)
RVOT:                  Appears normal         Lower Extremities:      Appears normal
LVOT:                  Appears normal

Other:  Fetus appears to be a female. Heels visualized. Technically difficult
due to fetal position.
Cervix Uterus Adnexa

Cervix
Length:           4.23  cm.
Normal appearance by transabdominal scan.

Uterus
No abnormality visualized.

Left Ovary
Within normal limits.

Right Ovary
Within normal limits.

Adnexa:       No abnormality visualized. No adnexal mass
visualized.
Impression

Single living intrauterine pregnancy at 21 weeks 6 days.
Appropriate fetal growth (35%).
Normal amniotic fluid volume.
The fetal anatomic survey is not complete.
No gross fetal anomalies identified.
Recommendations

Recommend follow-up ultrasound examination in 4 weeks to
reassess fetal growth and anatomy.

## 2019-02-24 IMAGING — US US MFM OB FOLLOW-UP
1 series · 14 of 28 positions shown · non-contrast
Comparison: none

[Series 1: us mfm ob follow-up · 14 of 52 slices shown]
[im 2/52]
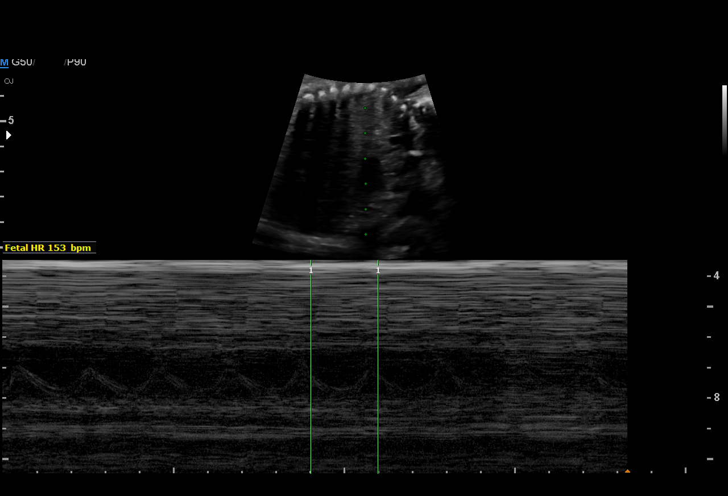
[im 6/52]
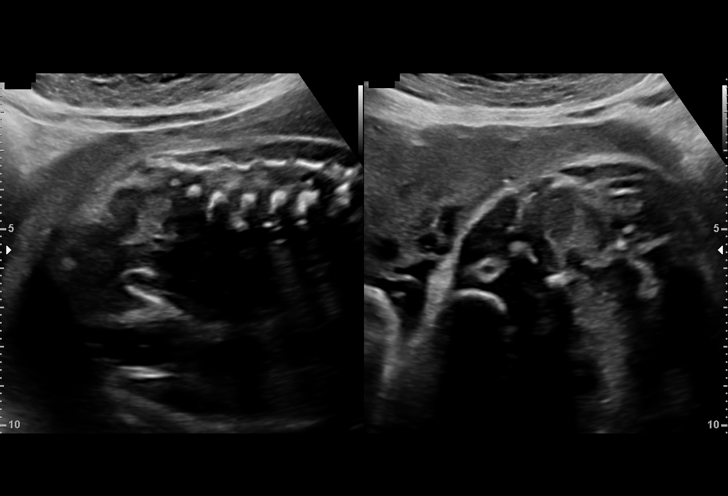
[im 10/52]
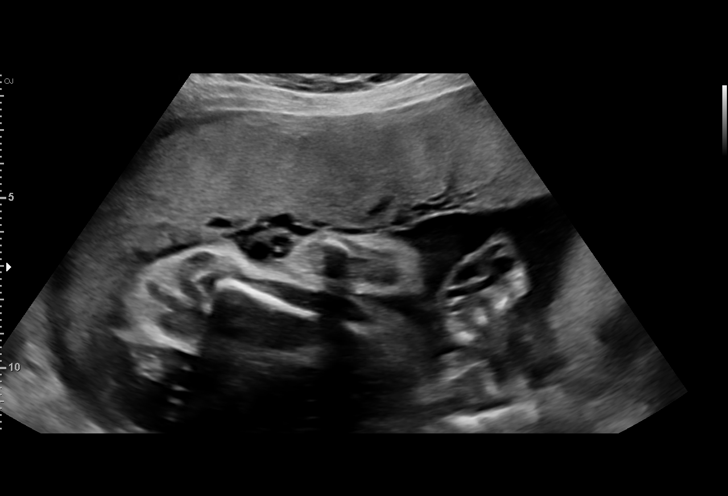
[im 14/52]
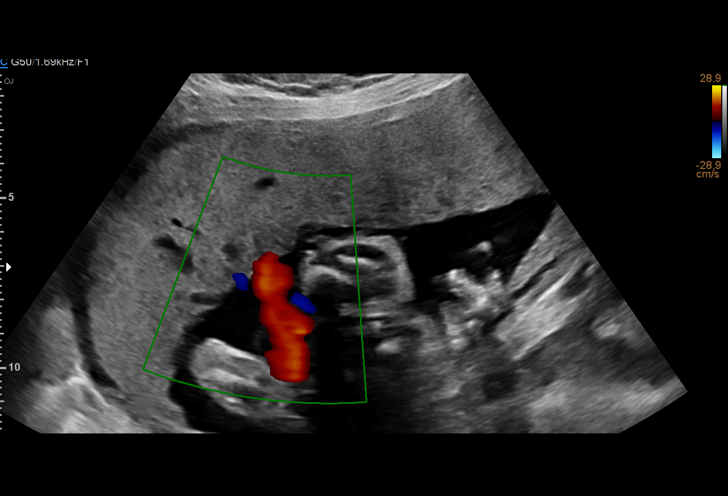
[im 18/52]
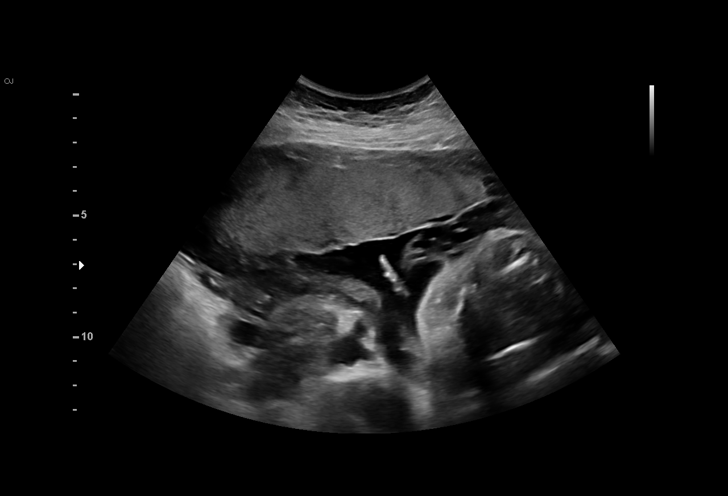
[im 21/52]
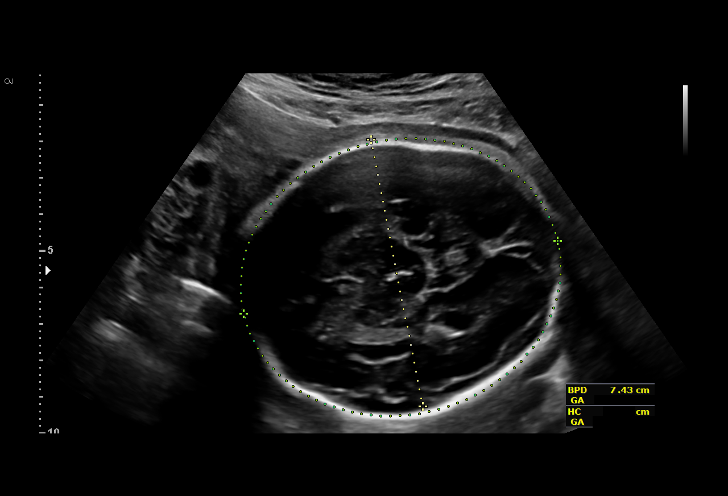
[im 25/52]
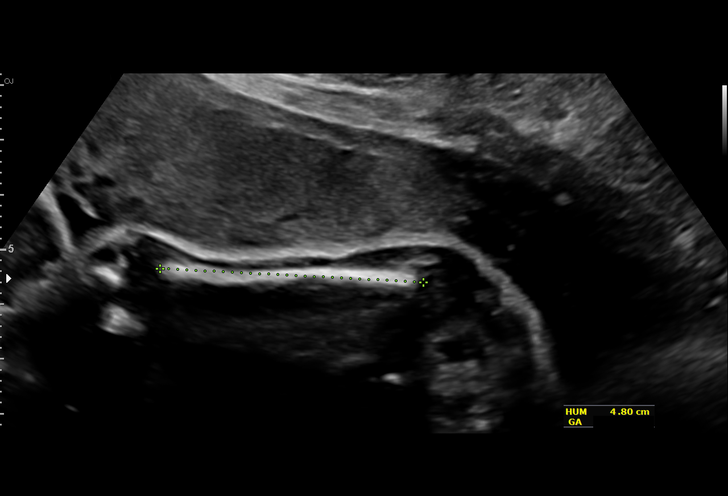
[im 29/52]
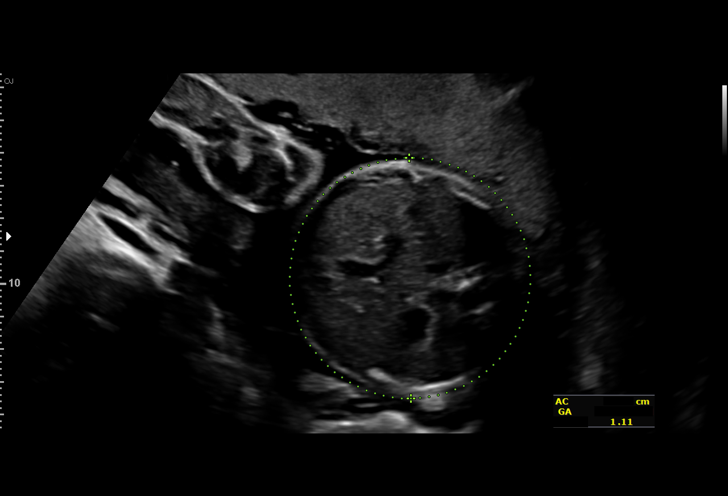
[im 33/52]
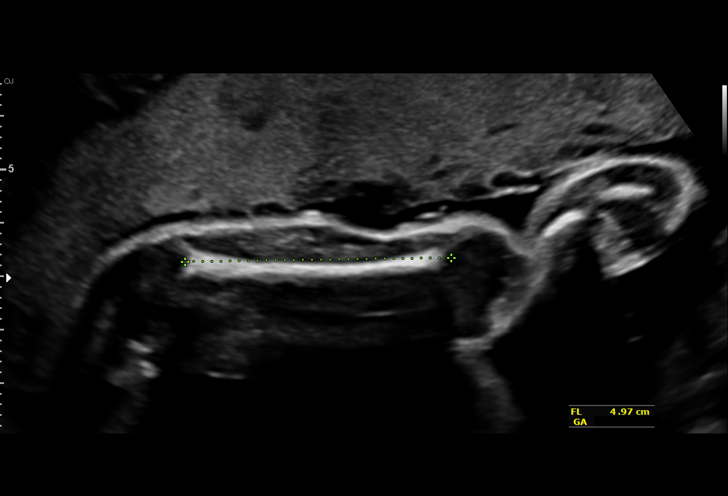
[im 36/52]
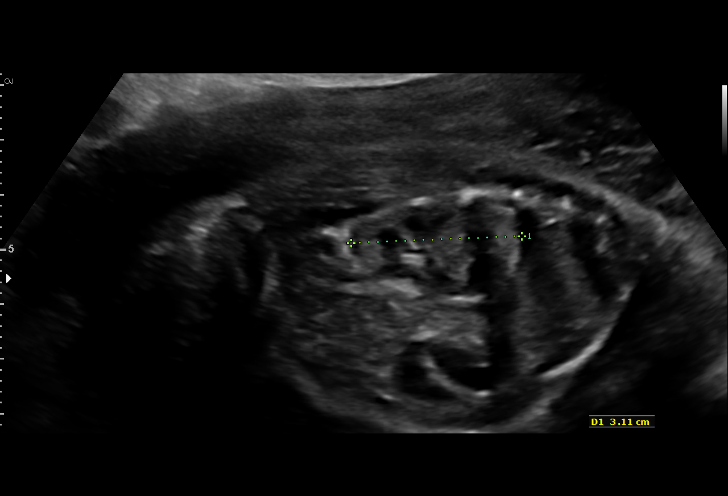
[im 40/52]
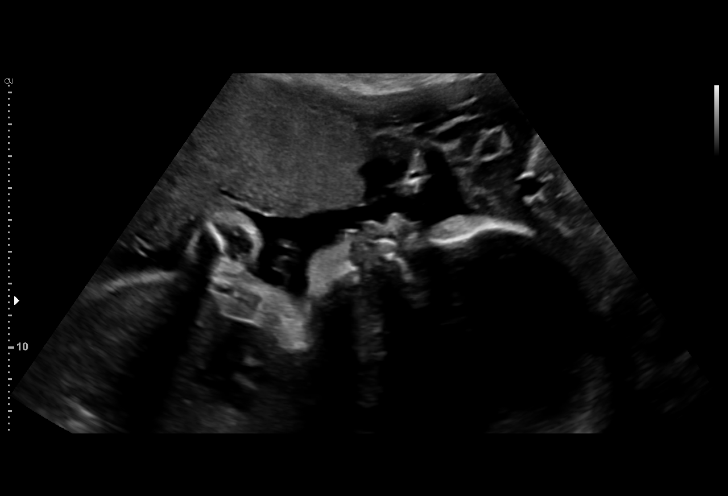
[im 44/52]
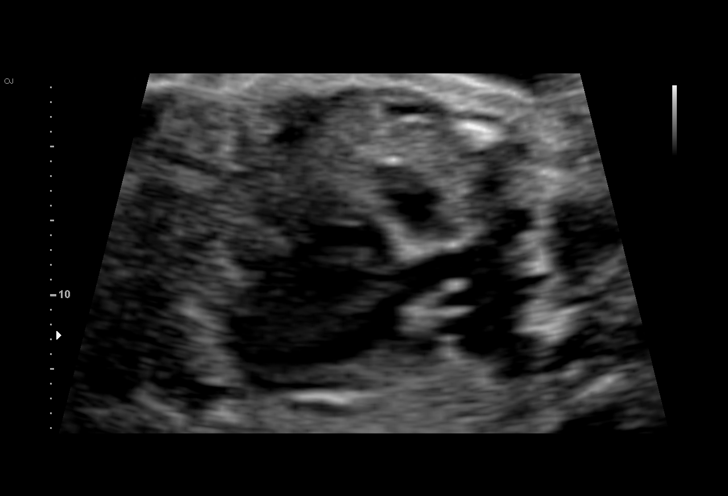
[im 48/52]
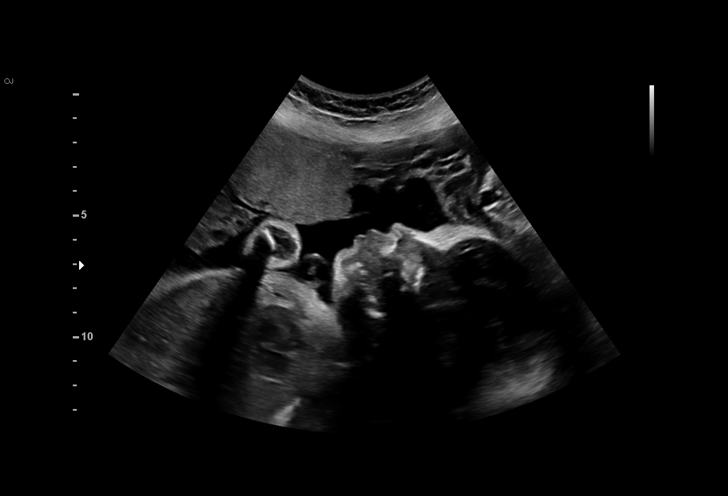
[im 52/52]
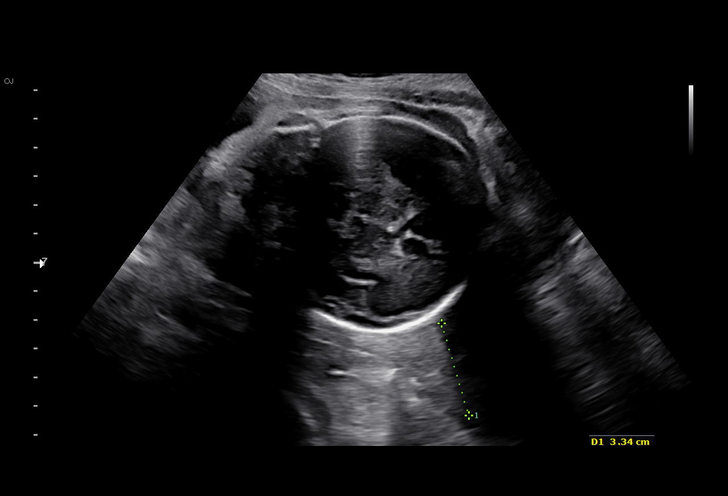

[14 of 28 positions shown; findings below may reference images not displayed]

Road [HOSPITAL]

Indications

27 weeks gestation of pregnancy
Encounter for other antenatal screening
follow-up
OB History

Gravidity:    3         Term:   2
Living:       2
Fetal Evaluation

Num Of Fetuses:     1
Fetal Heart         153
Rate(bpm):
Cardiac Activity:   Observed
Presentation:       Cephalic
Placenta:           Anterior, above cervical os
P. Cord Insertion:  Visualized

Amniotic Fluid
AFI FV:      Subjectively within normal limits

Largest Pocket(cm)
4.5
Biometry

BPD:      74.1  mm     G. Age:  29w 5d         92  %    CI:        83.12   %    70 - 86
FL/HC:      19.2   %    18.8 -
HC:      256.3  mm     G. Age:  27w 6d         25  %    HC/AC:      1.08        1.05 -
AC:      236.6  mm     G. Age:  28w 0d         50  %    FL/BPD:     66.5   %    71 - 87
FL:       49.3  mm     G. Age:  26w 4d         10  %    FL/AC:      20.8   %    20 - 24
HUM:      47.1  mm     G. Age:  27w 5d         46  %
CER:      33.9  mm     G. Age:  29w 3d         76  %
CM:        9.4  mm

Est. FW:    8877  gm      2 lb 7 oz     49  %
Gestational Age

LMP:           28w 4d        Date:  01/22/17                 EDD:   10/29/17
U/S Today:     28w 0d                                        EDD:   11/02/17
Best:          27w 5d     Det. By:  U/S  (06/30/17)          EDD:   11/04/17
Anatomy

Cranium:               Appears normal         Aortic Arch:            Previously seen
Cavum:                 Appears normal         Ductal Arch:            Previously seen
Ventricles:            Appears normal         Diaphragm:              Previously seen
Choroid Plexus:        Previously seen        Stomach:                Appears normal, left
sided
Cerebellum:            Appears normal         Abdomen:                Appears normal
Posterior Fossa:       Appears normal         Abdominal Wall:         Previously seen
Nuchal Fold:           Not applicable (>20    Cord Vessels:           Appears normal (3
wks GA)                                        vessel cord)
Face:                  Profile appears        Kidneys:                Appear normal
normal
Lips:                  Appears normal         Bladder:                Appears normal
Thoracic:              Appears normal         Spine:                  Appears normal
Heart:                 Previously seen        Upper Extremities:      Previously seen
RVOT:                  Appears normal         Lower Extremities:      Previously seen
LVOT:                  Appears normal

Other:  Fetus appears to be a female. Heels prev. visualized. Technically
difficult due to fetal position.
Cervix Uterus Adnexa

Cervix
Length:            3.3  cm.
Normal appearance by transabdominal scan.

Uterus
No abnormality visualized.

Left Ovary
Size(cm)       2.6  x   1.3    x  1.6       Vol(ml):
Within normal limits.

Right Ovary
Size(cm)       3.3  x   1.5    x  2.3       Vol(ml): 6
Within normal limits.

Cul De Sac:   No free fluid seen.

Adnexa:       No abnormality visualized.
Impression

Singleton intrauterine pregnancy at 27 weeks 5 days
gestation with fetal cardiac activity
Cephalic presentation
Anterior placenta without evidence of previa
Normal appearing fetal growth and amniotic fluid volume
Normal appearing cervical length
Completion of fetal anatomic survey
Recommendations

Follow-up ultrasounds as clinically indicated.

## 2021-02-03 ENCOUNTER — Encounter: Payer: Self-pay | Admitting: Advanced Practice Midwife

## 2021-02-03 ENCOUNTER — Other Ambulatory Visit: Payer: Self-pay

## 2021-02-03 ENCOUNTER — Other Ambulatory Visit (HOSPITAL_COMMUNITY)
Admission: RE | Admit: 2021-02-03 | Discharge: 2021-02-03 | Disposition: A | Discharge status: Home / Self Care | Payer: 59 | Source: Ambulatory Visit | Attending: Advanced Practice Midwife | Admitting: Advanced Practice Midwife

## 2021-02-03 ENCOUNTER — Ambulatory Visit (INDEPENDENT_AMBULATORY_CARE_PROVIDER_SITE_OTHER): Payer: 59 | Admitting: Advanced Practice Midwife

## 2021-02-03 VITALS — BP 115/78 | HR 90 | Ht 61.0 in | Wt 173.0 lb

## 2021-02-03 DIAGNOSIS — Z01419 Encounter for gynecological examination (general) (routine) without abnormal findings: Secondary | ICD-10-CM

## 2021-02-03 DIAGNOSIS — Z975 Presence of (intrauterine) contraceptive device: Secondary | ICD-10-CM | POA: Diagnosis not present

## 2021-02-03 DIAGNOSIS — Z30433 Encounter for removal and reinsertion of intrauterine contraceptive device: Secondary | ICD-10-CM | POA: Diagnosis not present

## 2021-02-03 DIAGNOSIS — Z3046 Encounter for surveillance of implantable subdermal contraceptive: Secondary | ICD-10-CM

## 2021-02-03 DIAGNOSIS — Z124 Encounter for screening for malignant neoplasm of cervix: Secondary | ICD-10-CM | POA: Insufficient documentation

## 2021-02-03 LAB — POCT URINE PREGNANCY: Preg Test, Ur: NEGATIVE

## 2021-02-03 MED ORDER — ETONOGESTREL 68 MG ~~LOC~~ IMPL
68.0000 mg | DRUG_IMPLANT | Freq: Once | SUBCUTANEOUS | Status: AC
  Administered 2021-02-03: 68 mg via SUBCUTANEOUS

## 2021-02-03 NOTE — Patient Instructions (Signed)
Nexplanon Instructions After Insertion  Keep bandage clean and dry for 24 hours  May use ice/Tylenol/Ibuprofen for soreness or pain  If you develop fever, drainage or increased warmth from incision site-contact office immediately   

## 2021-02-03 NOTE — Progress Notes (Signed)
Subjective:     Stacey Clements is a 30 y.o. female here at [facility] for a routine exam.  Current complaints: none, resumed periods x 4 months, Nexplanon placed 11/2017.  Personal health questionnaire reviewed: yes.  Do you have a primary care provider? yes Do you feel safe at home? yes    Risk factors for chronic health problems: Smoking: No Alchohol/how much: yes occasional Pt BMI: Body mass index is 32.69 kg/m.   Gynecologic History No LMP recorded. Contraception: Nexplanon Last Pap: 06/15/2017. Results were: normal Last mammogram: n/a.   Obstetric History OB History  Gravida Para Term Preterm AB Living  3 3 3     3   SAB IAB Ectopic Multiple Live Births        0 3    # Outcome Date GA Lbr Len/2nd Weight Sex Delivery Anes PTL Lv  3 Term 10/17/17 [redacted]w[redacted]d 08:37 / 00:07 7 lb 1.9 oz (3.23 kg) F Vag-Spont EPI  LIV  2 Term 10/31/14 [redacted]w[redacted]d 04:59 / 00:26 6 lb 5.8 oz (2.885 kg) F Vag-Spont EPI  LIV  1 Term 10/12/09 [redacted]w[redacted]d  7 lb 6 oz (3.345 kg) M Vag-Spont EPI  LIV     The following portions of the patient's history were reviewed and updated as appropriate: allergies, current medications, past family history, past medical history, past social history, past surgical history and problem list.  Review of Systems Pertinent items noted in HPI and remainder of comprehensive ROS otherwise negative.    Objective:   BP 115/78   Pulse 90   Ht 5\' 1"  (1.549 m)   Wt 173 lb (78.5 kg)   Breastfeeding No   BMI 32.69 kg/m  VS reviewed, nursing note reviewed,  Constitutional: well developed, well nourished, no distress HEENT: normocephalic CV: normal rate Pulm/chest wall: normal effort Breast Exam:  exam performed: right breast normal without mass, skin or nipple changes or axillary nodes, left breast normal without mass, skin or nipple changes or axillary nodes Abdomen: soft Neuro: alert and oriented x 3 Skin: warm, dry Psych: affect normal Pelvic exam: Performed: Cervix pink, visually closed,  without lesion, scant white creamy discharge, vaginal walls and external genitalia normal Bimanual exam: Cervix 0/long/high, firm, anterior, neg CMT, uterus nontender, nonenlarged, adnexa without tenderness, enlargement, or mass    Nexplanon Removal and Insertion  Patient identified, informed consent performed, consent signed.   Patient does understand that irregular bleeding is a very common side effect of this medication. She was advised to have backup contraception for one week after replacement of the implant. Pregnancy test in clinic today was negative.  Appropriate time out taken. Implanon site identified. Area prepped in usual sterile fashon. One ml of 1% lidocaine was used to anesthetize the area at the distal end of the implant. A small stab incision was made right beside the implant on the distal portion. The Nexplanon rod was grasped using hemostats and removed without difficulty. There was minimal blood loss. There were no complications. Area was then injected with 3 ml of 1 % lidocaine. She was re-prepped with betadine, Nexplanon removed from packaging, Device confirmed in needle, then inserted full length of needle and withdrawn per handbook instructions. Nexplanon was able to palpated in the patient's arm; patient palpated the insert herself.  There was minimal blood loss. Patient insertion site covered with guaze and a pressure bandage to reduce any bruising. The patient tolerated the procedure well and was given post procedure instructions.      Assessment/Plan:  1. Encounter for removal and reinsertion of Nexplanon --Removal and insertion today, pt tolerated well. See above procedure note. - POCT urine pregnancy--negative --Pt Nexplanon placed 11/2017, pt concerned that it was no longer effective since she was amenorrheic and now has period since December.  Discussed effectiveness of Nexplanon now approved for 4 years but may not control bleeding as well after 3 years.  Replaced today,  so pt will likely return to amenorrhea but irregular bleeding with Nexplanon is common. - etonogestrel (NEXPLANON) implant 68 mg  2. Screening for cervical cancer  - Cytology - PAP( [facility])  3. Well woman exam with routine gynecological exam --Doing well, has PCP, no Gyn concerns or complaints today --Monogamous partner x 13 years, declines need for STD screening   Follow up in: 1 year or as needed.   Sharen Counter, CNM 8:34 AM

## 2021-02-03 NOTE — Progress Notes (Signed)
Annual and Nexplanon Romoval /Re-Insertion. Inserted 11/16/2017. Last had unprotected intercourse x 3 days ago.   LMP: Irregular.  Last Pap: per pt about x 3 yrs ago STD Screening:Declines Family Hx of Breast Cancer L None Contraception : Nexplanon   UPT : Negative per provider ok to insert if Negative UPT.

## 2021-02-04 LAB — CYTOLOGY - PAP: Diagnosis: NEGATIVE
